# Patient Record
Sex: Female | Born: 1985 | Race: White | Hispanic: No | Smoking: Former smoker
Health system: Southern US, Community
[De-identification: ages and names within clinical notes are randomized; demographics above are authoritative.]

## PROBLEM LIST (undated history)

## (undated) DIAGNOSIS — F419 Anxiety disorder, unspecified: Secondary | ICD-10-CM

## (undated) DIAGNOSIS — G43109 Migraine with aura, not intractable, without status migrainosus: Secondary | ICD-10-CM

## (undated) DIAGNOSIS — Z9189 Other specified personal risk factors, not elsewhere classified: Secondary | ICD-10-CM

## (undated) DIAGNOSIS — R51 Headache: Secondary | ICD-10-CM

## (undated) DIAGNOSIS — G2581 Restless legs syndrome: Secondary | ICD-10-CM

## (undated) DIAGNOSIS — R030 Elevated blood-pressure reading, without diagnosis of hypertension: Secondary | ICD-10-CM

## (undated) DIAGNOSIS — T7840XA Allergy, unspecified, initial encounter: Secondary | ICD-10-CM

## (undated) DIAGNOSIS — O321XX0 Maternal care for breech presentation, not applicable or unspecified: Secondary | ICD-10-CM

## (undated) DIAGNOSIS — R12 Heartburn: Secondary | ICD-10-CM

## (undated) DIAGNOSIS — O26899 Other specified pregnancy related conditions, unspecified trimester: Secondary | ICD-10-CM

## (undated) HISTORY — PX: MOUTH SURGERY: SHX715

## (undated) HISTORY — DX: Migraine with aura, not intractable, without status migrainosus: G43.109

## (undated) HISTORY — DX: Other specified personal risk factors, not elsewhere classified: Z91.89

## (undated) HISTORY — DX: Allergy, unspecified, initial encounter: T78.40XA

## (undated) HISTORY — DX: Anxiety disorder, unspecified: F41.9

---

## 2008-11-05 LAB — CONVERTED CEMR LAB: Pap Smear: NEGATIVE

## 2009-11-20 ENCOUNTER — Ambulatory Visit: Payer: Self-pay | Admitting: Internal Medicine

## 2009-11-20 LAB — CONVERTED CEMR LAB
Basophils Absolute: 0 10*3/uL (ref 0.0–0.1)
Bilirubin, Direct: 0.1 mg/dL (ref 0.0–0.3)
Eosinophils Absolute: 0.1 10*3/uL (ref 0.0–0.7)
Eosinophils Relative: 2.2 % (ref 0.0–5.0)
GFR calc non Af Amer: 146.93 mL/min (ref >60)
Glucose, Bld: 93 mg/dL (ref 70–99)
HDL: 54.2 mg/dL (ref >39.00)
MCHC: 33.1 g/dL (ref 30.0–36.0)
MCV: 97.4 fL (ref 78.0–100.0)
Monocytes Absolute: 0.5 10*3/uL (ref 0.1–1.0)
Neutrophils Relative %: 49.8 % (ref 43.0–77.0)
Nitrite: NEGATIVE
Platelets: 201 10*3/uL (ref 150.0–400.0)
Potassium: 4 meq/L (ref 3.5–5.1)
Sodium: 141 meq/L (ref 135–145)
Specific Gravity, Urine: >=1.03 (ref 1.000–1.030)
Total Bilirubin: 0.4 mg/dL (ref 0.3–1.2)
Total Protein, Urine: NEGATIVE mg/dL
VLDL: 22.6 mg/dL (ref 0.0–40.0)
WBC: 5.1 10*3/uL (ref 4.5–10.5)
pH: 5.5 (ref 5.0–8.0)

## 2009-11-23 ENCOUNTER — Ambulatory Visit: Payer: Self-pay | Admitting: Internal Medicine

## 2009-11-23 ENCOUNTER — Encounter (INDEPENDENT_AMBULATORY_CARE_PROVIDER_SITE_OTHER): Payer: Self-pay | Admitting: *Deleted

## 2009-11-23 DIAGNOSIS — J45909 Unspecified asthma, uncomplicated: Secondary | ICD-10-CM | POA: Insufficient documentation

## 2009-11-23 DIAGNOSIS — Z87448 Personal history of other diseases of urinary system: Secondary | ICD-10-CM | POA: Insufficient documentation

## 2009-11-23 DIAGNOSIS — Z9189 Other specified personal risk factors, not elsewhere classified: Secondary | ICD-10-CM | POA: Insufficient documentation

## 2010-11-19 NOTE — Letter (Signed)
Summary: Out of Work  [facility]  [address] [place], [place] [address]   Phone: [phone]  Fax: [phone]    November 23, 2009   Employee:  Renee Shaw    To Whom It May Concern:   For Medical reasons, please excuse the above named employee from work for the following dates:  Start: 11/23/09    End: 11/25/09, Return to work on Monday 11/26/09    If you need additional information, please feel free to contact our office.         Sincerely,    Dr. Rene Paci

## 2010-11-19 NOTE — Assessment & Plan Note (Signed)
Summary: NEW [employer] PT--PHYSICAL--PKG--STC   Vital Signs:  Patient profile:   25 year old female Height:      68 inches (172.72 cm) Weight:      178.8 pounds (81.27 kg) BMI:     27.28 O2 Sat:      97 % on Room air Temp:     98.8 degrees F (37.11 degrees C) oral Pulse rate:   84 / minute BP sitting:   112 / 76  (left arm) Cuff size:   regular  Vitals Entered By: Orlan Leavens (November 23, 2009 8:13 AM)  O2 Flow:  Room air CC: New patient Is Patient Diabetic? No Pain Assessment Patient in pain? no        Primary Care Provider:  Newt Lukes MD  CC:  New patient.  History of Present Illness: new pt to me and our practice -  patient is here today for annual physical.  Patient feels generally well, no health concerns or problems until yesterday....   c/o diffuse mylaise and cough onset yesterday at noon problem precipitated by sick contacts at work a/w ST and chills but no fever, headache, sneezing or rash +thin sputum with cough, no nasal drainage symptoms better with allegra, worse at night  Preventive Screening-Counseling & Management  Alcohol-Tobacco     Alcohol drinks/day: <1     Alcohol Counseling: not indicated; use of alcohol is not excessive or problematic     Smoking Status: current     Tobacco Counseling: to quit use of tobacco products  Caffeine-Diet-Exercise     Does Patient Exercise: yes     Exercise Counseling: to improve exercise regimen     Depression Counseling: not indicated; screening negative for depression  Hep-HIV-STD-Contraception     Contraception Counseling: not indicated; no questions/concerns expressed  Safety-Violence-Falls     Seat Belt Use: yes     Helmet Use: yes     Firearms in the Home: no firearms in the home     Smoke Detectors: yes     Violence in the Home: no risk noted     Sexual Abuse: no  Clinical Review Panels:  Prevention   Last Pap Smear:  Interpretation/Result:Negative for intraepithelial Lesion or  Malignancy.    (11/05/2008)  Lipid Management   Cholesterol:  198 (11/20/2009)   LDL (bad choesterol):  121 (11/20/2009)   HDL (good cholesterol):  54.20 (11/20/2009)  CBC   WBC:  5.1 (11/20/2009)   RBC:  3.98 (11/20/2009)   Hgb:  12.8 (11/20/2009)   Hct:  38.7 (11/20/2009)   Platelets:  201.0 (11/20/2009)   MCV  97.4 (11/20/2009)   MCHC  33.1 (11/20/2009)   RDW  11.5 (11/20/2009)   PMN:  49.8 (11/20/2009)   Lymphs:  38.4 (11/20/2009)   Monos:  9.2 (11/20/2009)   Eosinophils:  2.2 (11/20/2009)   Basophil:  0.4 (11/20/2009)  Complete Metabolic Panel   Glucose:  93 (11/20/2009)   Sodium:  141 (11/20/2009)   Potassium:  4.0 (11/20/2009)   Chloride:  110 (11/20/2009)   CO2:  26 (11/20/2009)   BUN:  9 (11/20/2009)   Creatinine:  0.7 (11/20/2009)   Albumin:  3.9 (11/20/2009)   Total Protein:  7.0 (11/20/2009)   Calcium:  9.1 (11/20/2009)   Total Bili:  0.4 (11/20/2009)   Alk Phos:  42 (11/20/2009)   SGPT (ALT):  23 (11/20/2009)   SGOT (AST):  22 (11/20/2009)   Current Medications (verified): 1)  Multivitamins  Tabs (Multiple Vitamin) .... Once  Daily 2)  Mononessa 0.25-35 Mg-Mcg Tabs (Norgestimate-Eth Estradiol) .... Take 1 By Mouth Once Daily 3)  Fexofenadine Hcl 180 Mg Tabs (Fexofenadine Hcl) .... Take 1 By Mouth Once Daily 4)  Epipen 0.3 Mg/0.66ml Devi (Epinephrine) .... Use As Needed 5)  Prednisone 20 Mg Tabs (Prednisone) .... Take 1 By Mouth Once Daily For 21 Days 6)  Nasonex 50 Mcg/act Susp (Mometasone Furoate) .... Use Prn 7)  Astepro 0.15 % Soln (Azelastine Hcl) .... Use As Needed  Allergies (verified): No Known Drug Allergies  Past History:  Past Medical History: Asthma allergic rhinitis  MD rooster: gyn - [facility] - [facility] [place]  Past Surgical History: surgery to remove redicularin 2002 cyst in mouth 2006  Family History: Family History of Alcoholism/Addiction (other relative) Family History Diabetes 1st degree relative (parent) Family  History Lung cancer (grandparent) Family History of Prostate CA 1st degree relative <50 Brewing technologist)  Social History: Current Smoker single, lives alone but has boyfriend occassional alcohol employed as Museum/gallery exhibitions officer Smoking Status:  current Does Patient Exercise:  yes Seat Belt Use:  yes  Review of Systems       see HPI above. I have reviewed all other systems and they were negative.   Physical Exam  General:  alert, well-developed, well-nourished, and cooperative to examination.    Head:  Normocephalic and atraumatic without obvious abnormalities. No apparent alopecia or balding. Eyes:  vision grossly intact; pupils equal, round and reactive to light.  conjunctiva and lids normal.    Ears:  normal pinnae bilaterally, without erythema, swelling, or tenderness to palpation. TMs clear, without effusion, or cerumen impaction. Hearing grossly normal bilaterally  Nose:  External nasal examination shows no deformity or inflammation. Nasal mucosa are pink and moist without lesions or exudates. Mouth:  teeth and gums in good repair; mucous membranes moist, without lesions or ulcers. oropharynx clear without exudate, min erythema.  Neck:  supple, full ROM, no masses, no thyromegaly; no thyroid nodules or tenderness. no JVD or carotid bruits.   Lungs:  normal respiratory effort, no intercostal retractions or use of accessory muscles; few scattered rhonchi bilaterally - no crackles and no wheezes.    Heart:  normal rate, regular rhythm, no murmur, and no rub. BLE without edema. normal DP pulses and normal cap refill in all 4 extremities    Abdomen:  soft, non-tender, normal bowel sounds, no distention; no masses and no appreciable hepatomegaly or splenomegaly.   Genitalia:  defer to gyn Msk:  No deformity or scoliosis noted of thoracic or lumbar spine.   Neurologic:  alert & oriented X3 and cranial nerves II-XII symetrically intact.  strength normal in all extremities, sensation intact to  light touch, and gait normal. speech fluent without dysarthria or aphasia; follows commands with good comprehension.  Skin:  no rashes, vesicles, ulcers, or erythema. No nodules or irregularity to palpation.  Psych:  Oriented X3, memory intact for recent and remote, normally interactive, good eye contact, not anxious appearing, not depressed appearing, and not agitated.      Impression & Recommendations:  Problem # 1:  PREVENTIVE HEALTH CARE (ICD-V70.0) Patient has been counseled on age-appropriate routine health concerns for screening and prevention. These are reviewed and up-to-date. Immunizations are up-to-date or declined. Labs reviewed.   Problem # 2:  ACUTE BRONCHITIS (ICD-466.0)  His updated medication list for this problem includes:    Azithromycin 250 Mg Tabs (Azithromycin) .Marland Kitchen... 2 tabs by mouth today, then 1 by mouth daily starting tomorrow  Take antibiotics  and other medications as directed. Encouraged to push clear liquids, get enough rest, and take acetaminophen as needed. To be seen in 5-7 days if no improvement, sooner if worse.  Complete Medication List: 1)  Multivitamins Tabs (Multiple vitamin) .... Once daily 2)  Mononessa 0.25-35 Mg-mcg Tabs (Norgestimate-eth estradiol) .... Take 1 by mouth once daily 3)  Fexofenadine Hcl 180 Mg Tabs (Fexofenadine hcl) .... Take 1 by mouth once daily 4)  Epipen 0.3 Mg/0.42ml Devi (Epinephrine) .... Use as needed 5)  Prednisone 20 Mg Tabs (Prednisone) .... Take 1 by mouth once daily for 21 days 6)  Nasonex 50 Mcg/act Susp (Mometasone furoate) .... Use prn 7)  Astepro 0.15 % Soln (Azelastine hcl) .... Use as needed 8)  Azithromycin 250 Mg Tabs (Azithromycin) .... 2 tabs by mouth today, then 1 by mouth daily starting tomorrow  Patient Instructions: 1)  it was good to see you today.  2)  labs reviewed and look good! 3)  antibiotics for you bronchitis - Zpack - your prescription has been electronically submitted to your pharmacy. Please  take as directed. Contact our office if you believe you're having problems with the medication(s).  Caution with birth control while on antibiotics as discussed 4)  If you have very high fevers (>102) call and will we consider treating with Tamiflu for possible flu, but no evidence for flu at this point in time 5)  Acute bronchitis:  take over the counter cough medications. call if no improvement in  5-7 days, sooner if increasing cough, fever, or new symptoms (shortness of breath, chest pain). 6)  STOP SMOKING!! 7)  Please schedule a follow-up appointment annually for labs, sooner if problems.  Prescriptions: AZITHROMYCIN 250 MG TABS (AZITHROMYCIN) 2 tabs by mouth today, then 1 by mouth daily starting tomorrow  #6 x 0   Entered and Authorized by:   Newt Lukes MD   Signed by:   Newt Lukes MD on 11/23/2009   Method used:   Electronically to        [facility] [address] [id number]* (retail)       [address] [place], [place]  [address]       Ph: [phone]       Fax: [phone]   RxID:   [id number]    Pap Smear  Procedure date:  11/05/2008  Findings:      Interpretation/Result:Negative for intraepithelial Lesion or Malignancy.

## 2012-02-11 ENCOUNTER — Ambulatory Visit: Payer: Self-pay | Admitting: Internal Medicine

## 2012-03-18 ENCOUNTER — Ambulatory Visit: Payer: 59 | Admitting: Internal Medicine

## 2012-03-18 VITALS — BP 134/83 | HR 81 | Temp 98.4°F | Resp 16 | Ht 69.0 in | Wt 200.0 lb

## 2012-03-18 DIAGNOSIS — G43109 Migraine with aura, not intractable, without status migrainosus: Secondary | ICD-10-CM

## 2012-03-18 MED ORDER — CYCLOBENZAPRINE HCL 10 MG PO TABS: 10.0000 mg | ORAL_TABLET | Freq: Three times a day (TID) | ORAL | Status: DC | PRN

## 2012-03-18 NOTE — Patient Instructions (Signed)

## 2012-03-18 NOTE — Progress Notes (Signed)
  Subjective:    Patient ID: Renee Shaw, female    DOB: 1986-01-26, 26 y.o.   MRN: [id number]  HPI 2007 dx with migraine with visual aura, had neg ct scan of head. Has had 3 migraines with aura this week 1st time since 2007. Smokes and is on bcp.    Review of Systems Asthma and reflux    Objective:   Physical Exam  Constitutional: She is oriented to person, place, and time. She appears well-developed and well-nourished.  HENT:  Nose: Nose normal.  Eyes: EOM are normal. Pupils are equal, round, and reactive to light.  Neck: Normal range of motion. Neck supple.  Cardiovascular: Normal rate, regular rhythm and normal heart sounds.   Pulmonary/Chest: Effort normal and breath sounds normal.  Neurological: She is alert and oriented to person, place, and time. She displays normal reflexes. No cranial nerve deficit. Coordination normal.       Balance excellent and no drift  Skin: Skin is warm and dry.  Psychiatric: She has a normal mood and affect. Her behavior is normal. Judgment and thought content normal.          Assessment & Plan:  Migraine with aura

## 2012-03-24 ENCOUNTER — Telehealth: Payer: Self-pay | Admitting: Internal Medicine

## 2012-03-24 DIAGNOSIS — Z309 Encounter for contraceptive management, unspecified: Secondary | ICD-10-CM

## 2012-03-24 NOTE — Telephone Encounter (Signed)
This note entered into correct chart.

## 2012-03-24 NOTE — Telephone Encounter (Signed)
Brittney Moser,   dob [birth year]04/1987. Thanks  Seen on Monday

## 2012-03-24 NOTE — Telephone Encounter (Signed)
I called patient, but she says that she has never been seen by [place]. She is a patient of Dr. Felicity Coyer. This was all entered in the wrong chart. Which patient is this suppose to go to?

## 2012-03-24 NOTE — Telephone Encounter (Signed)
We ordered a UPT from [facility] and got a UA instead!   All labs normal,  No anemia or iron deficiency.  She can come in for Depo provera injection but still needs to have a documented negative urine pregnancy tet so will do it when she comes for her shot.

## 2012-05-12 ENCOUNTER — Encounter: Payer: Self-pay | Admitting: Internal Medicine

## 2012-05-12 ENCOUNTER — Ambulatory Visit (INDEPENDENT_AMBULATORY_CARE_PROVIDER_SITE_OTHER): Payer: 59 | Admitting: Internal Medicine

## 2012-05-12 VITALS — BP 140/72 | HR 91 | Temp 98.7°F | Ht 69.0 in | Wt 201.8 lb

## 2012-05-12 DIAGNOSIS — G43109 Migraine with aura, not intractable, without status migrainosus: Secondary | ICD-10-CM

## 2012-05-12 DIAGNOSIS — F411 Generalized anxiety disorder: Secondary | ICD-10-CM

## 2012-05-12 DIAGNOSIS — F419 Anxiety disorder, unspecified: Secondary | ICD-10-CM | POA: Insufficient documentation

## 2012-05-12 MED ORDER — BUTALBITAL-APAP-CAFFEINE 50-325-40 MG PO TABS: 1.0000 | ORAL_TABLET | Freq: Two times a day (BID) | ORAL | Status: AC | PRN

## 2012-05-12 MED ORDER — ALPRAZOLAM 0.25 MG PO TABS: 0.2500 mg | ORAL_TABLET | Freq: Two times a day (BID) | ORAL | Status: DC | PRN

## 2012-05-12 NOTE — Progress Notes (Signed)
Subjective:    Patient ID: Renee Shaw, female    DOB: 1986-01-29, 26 y.o.   MRN: [id number]  Migraine  This is a recurrent problem. Episode onset: 2007 dx - MRI/labs negative at that time; no recurrence until 02/2012. The problem occurs intermittently. The problem has been waxing and waning. The pain is located in the retro-orbital region. The pain does not radiate. The pain quality is similar to prior headaches. The quality of the pain is described as dull, band-like and pulsating. The pain is moderate. Associated symptoms include a visual change (preceeding HA symptoms). Pertinent negatives include no abnormal behavior, blurred vision, coughing, dizziness, fever, hearing loss, insomnia, nausea, neck pain, numbness, phonophobia, photophobia, scalp tenderness, seizures, sinus pressure, sore throat, swollen glands or weakness. The symptoms are aggravated by [facility] and work (?change BCP 02/2012 - now on progestin only - no estrogen). She has tried NSAIDs for the symptoms. The treatment provided no relief. Her past medical history is significant for hypertension, migraine headaches and migraines in the family. There is no history of cancer, recent head traumas or sinus disease.   Past Medical History  Diagnosis Date  . ASTHMA   . CHICKENPOX, HX OF   . Migraine headache with aura     Review of Systems  Constitutional: Negative for fever.  HENT: Negative for hearing loss, sore throat, neck pain and sinus pressure.   Eyes: Negative for blurred vision and photophobia.  Respiratory: Negative for cough.   Gastrointestinal: Negative for nausea.  Neurological: Negative for dizziness, seizures, weakness and numbness.  Psychiatric/Behavioral: The patient does not have insomnia.        Objective:   Physical Exam BP 140/72  Pulse 91  Temp 98.7 F (37.1 C) (Oral)  Ht 5\' 9"  (1.753 m)  Wt 201 lb 12.8 oz (91.536 kg)  BMI 29.80 kg/m2  SpO2 98% Wt Readings from Last 3 Encounters:  05/12/12 201 lb  12.8 oz (91.536 kg)  03/18/12 200 lb (90.719 kg)  11/23/09 178 lb 12.8 oz (81.103 kg)   Constitutional: She appears well-developed and well-nourished. No distress.  HENT: Head: Normocephalic and atraumatic. Ears: B TMs ok, no erythema or effusion; Nose: Nose normal. Mouth/Throat: Oropharynx is clear and moist. No oropharyngeal exudate.  Eyes: Conjunctivae and EOM are normal. Pupils are equal, round, and reactive to light. No scleral icterus.  Neck: Normal range of motion. Neck supple. No JVD present. No thyromegaly present.  Cardiovascular: Normal rate, regular rhythm and normal heart sounds.  No murmur heard. No BLE edema. Pulmonary/Chest: Effort normal and breath sounds normal. No respiratory distress. She has no wheezes.  Neurological: She is alert and oriented to person, place, and time. No cranial nerve deficit. Coordination normal.  Skin: Skin is warm and dry. No rash noted. No erythema.  Psychiatric: She has a normal mood and affect. Her behavior is normal. Judgment and thought content normal.   Lab Results  Component Value Date   WBC 5.1 11/20/2009   HGB 12.8* 11/20/2009   HCT 38.7* 11/20/2009   PLT 201.0 11/20/2009   GLUCOSE 93 11/20/2009   CHOL 198 11/20/2009   TRIG 113.0 11/20/2009   HDL 54.20 11/20/2009   LDLCALC 121* 11/20/2009   ALT 23 11/20/2009   AST 22 11/20/2009   NA 141 11/20/2009   K 4.0 11/20/2009   CL 110 11/20/2009   CREATININE 0.7 11/20/2009   BUN 9 11/20/2009   CO2 26 11/20/2009   TSH 2.43 11/20/2009       Assessment &  Plan:  See problem list. Medications and labs reviewed today.

## 2012-05-12 NOTE — Assessment & Plan Note (Signed)
Panic attacks with recurrent migraines Used low dose xanax prn in past for same -  resume now and consider SSRI tx daily if ineffective relief with xanax Support offered

## 2012-05-12 NOTE — Assessment & Plan Note (Addendum)
Hx same - dx 2007 - reports prior MRI and labs unremarkable at that time Now recurrent events since 02/2012 Not responding to NSAID therapy Neuro exam benign today, associated with significant anxiety component  Check MRI brain now, start esgic prn and encouraged follow up with gyn re: change in BCP?

## 2012-05-12 NOTE — Patient Instructions (Signed)
It was good to see you today. We have reviewed your prior records including labs and tests today we'll make referral to for MRI brain. Our office will contact you regarding appointment(s) once made. Use Esgic as needed for headache symptoms and xanax as needed for panic/anxiety symptoms  Your prescription(s) have been submitted to your pharmacy. Please take as directed and contact our office if you believe you are having problem(s) with the medication(s).

## 2012-05-19 ENCOUNTER — Ambulatory Visit
Admission: RE | Admit: 2012-05-19 | Discharge: 2012-05-19 | Disposition: A | Discharge status: Home / Self Care | Payer: 59 | Source: Ambulatory Visit | Attending: Internal Medicine | Admitting: Internal Medicine

## 2012-05-19 DIAGNOSIS — G43109 Migraine with aura, not intractable, without status migrainosus: Secondary | ICD-10-CM

## 2012-05-20 ENCOUNTER — Other Ambulatory Visit: Payer: Self-pay | Admitting: Internal Medicine

## 2012-05-20 DIAGNOSIS — G43109 Migraine with aura, not intractable, without status migrainosus: Secondary | ICD-10-CM

## 2012-05-20 DIAGNOSIS — R9089 Other abnormal findings on diagnostic imaging of central nervous system: Secondary | ICD-10-CM

## 2012-06-03 ENCOUNTER — Telehealth: Payer: Self-pay

## 2012-06-03 NOTE — Telephone Encounter (Signed)
Ok to schedule follow up with me as needed (if continued migraine symptoms)

## 2012-06-03 NOTE — Telephone Encounter (Signed)
Pt advised and transferred to schedule appt.  

## 2012-06-03 NOTE — Telephone Encounter (Signed)
Pt called stating that she is unable to afford an appt with neuro that she was referred to. Pt is requesting advisement from MD on available options in Primary Care, please advise.

## 2012-06-04 ENCOUNTER — Encounter: Payer: Self-pay | Admitting: Internal Medicine

## 2012-06-04 ENCOUNTER — Ambulatory Visit (INDEPENDENT_AMBULATORY_CARE_PROVIDER_SITE_OTHER): Payer: 59 | Admitting: Internal Medicine

## 2012-06-04 VITALS — BP 130/82 | HR 78 | Temp 97.5°F | Resp 16 | Wt 203.8 lb

## 2012-06-04 DIAGNOSIS — G43109 Migraine with aura, not intractable, without status migrainosus: Secondary | ICD-10-CM

## 2012-06-04 DIAGNOSIS — F419 Anxiety disorder, unspecified: Secondary | ICD-10-CM

## 2012-06-04 DIAGNOSIS — F411 Generalized anxiety disorder: Secondary | ICD-10-CM

## 2012-06-04 MED ORDER — ALPRAZOLAM 0.25 MG PO TABS: 0.2500 mg | ORAL_TABLET | Freq: Two times a day (BID) | ORAL | Status: DC | PRN

## 2012-06-04 MED ORDER — IBUPROFEN 800 MG PO TABS: 800.0000 mg | ORAL_TABLET | Freq: Three times a day (TID) | ORAL | Status: AC | PRN

## 2012-06-04 MED ORDER — TOPIRAMATE 25 MG PO TABS: 25.0000 mg | ORAL_TABLET | Freq: Two times a day (BID) | ORAL | Status: DC

## 2012-06-04 MED ORDER — BUTALBITAL-APAP-CAFFEINE 50-325-40 MG PO TABS: 1.0000 | ORAL_TABLET | Freq: Two times a day (BID) | ORAL | Status: AC | PRN

## 2012-06-04 NOTE — Progress Notes (Signed)
Subjective:    Patient ID: Renee Shaw, female    DOB: 03-26-1986, 26 y.o.   MRN: [id number]  Migraine  This is a chronic problem. Episode onset: 2007 dx - MRI/labs negative at that time; no recurrence until 02/2012 - MRI summer 2013 unremarkable. The problem occurs intermittently. The problem has been waxing and waning. The pain is located in the retro-orbital region. The pain does not radiate. The pain quality is similar to prior headaches. The quality of the pain is described as dull, band-like and pulsating. The pain is moderate. Associated symptoms include a visual change (preceeding HA symptoms). Pertinent negatives include no abnormal behavior, blurred vision, coughing, dizziness, fever, hearing loss, insomnia, nausea, neck pain, numbness, phonophobia, photophobia, scalp tenderness, seizures, sinus pressure, sore throat, swollen glands or weakness. The symptoms are aggravated by [facility] and work (?change BCP 02/2012 - now on progestin only - no estrogen). She has tried NSAIDs for the symptoms. The treatment provided no relief. Her past medical history is significant for hypertension, migraine headaches and migraines in the family. There is no history of cancer, recent head traumas or sinus disease.   Past Medical History  Diagnosis Date  . ASTHMA   . CHICKENPOX, HX OF   . Migraine headache with aura     Review of Systems  Constitutional: Negative for fever.  HENT: Negative for hearing loss, sore throat, neck pain and sinus pressure.   Eyes: Negative for blurred vision and photophobia.  Respiratory: Negative for cough.   Gastrointestinal: Negative for nausea.  Neurological: Negative for dizziness, seizures, weakness and numbness.  Psychiatric/Behavioral: The patient does not have insomnia.        Objective:   Physical Exam  BP 130/82  Pulse 78  Temp 97.5 F (36.4 C) (Oral)  Resp 16  Wt 203 lb 12 oz (92.42 kg)  SpO2 99% Wt Readings from Last 3 Encounters:  06/04/12 203 lb 12  oz (92.42 kg)  05/12/12 201 lb 12.8 oz (91.536 kg)  03/18/12 200 lb (90.719 kg)   Constitutional: She appears well-developed and well-nourished. No distress.  HENT: Head: Normocephalic and atraumatic. Ears: B TMs ok, no erythema or effusion; Nose: Nose normal. Mouth/Throat: Oropharynx is clear and moist. No oropharyngeal exudate.  Eyes: Conjunctivae and EOM are normal. Pupils are equal, round, and reactive to light. No scleral icterus.  Neck: Normal range of motion. Neck supple. No JVD present. No thyromegaly present.  Cardiovascular: Normal rate, regular rhythm and normal heart sounds.  No murmur heard. No BLE edema. Pulmonary/Chest: Effort normal and breath sounds normal. No respiratory distress. She has no wheezes.  Neurological: She is alert and oriented to person, place, and time. No cranial nerve deficit. Coordination normal.  Skin: Skin is warm and dry. No rash noted. No erythema.  Psychiatric: She has a normal mood and affect. Her behavior is normal. Judgment and thought content normal.   Lab Results  Component Value Date   WBC 5.1 11/20/2009   HGB 12.8* 11/20/2009   HCT 38.7* 11/20/2009   PLT 201.0 11/20/2009   GLUCOSE 93 11/20/2009   CHOL 198 11/20/2009   TRIG 113.0 11/20/2009   HDL 54.20 11/20/2009   LDLCALC 121* 11/20/2009   ALT 23 11/20/2009   AST 22 11/20/2009   NA 141 11/20/2009   K 4.0 11/20/2009   CL 110 11/20/2009   CREATININE 0.7 11/20/2009   BUN 9 11/20/2009   CO2 26 11/20/2009   TSH 2.43 11/20/2009   Mr Brain Wo Contrast  05/20/2012  *  RADIOLOGY REPORT*  Clinical Data: Migraine headache with aura  MRI HEAD WITHOUT CONTRAST  Technique:  Multiplanar, multiecho pulse sequences of the brain and surrounding structures were obtained according to standard protocol without intravenous contrast.  Comparison: None.  Findings: Ventricle size is normal.  Hyperintensity right frontal periventricular white matter.  No other white matter lesions are identified.  Brainstem is intact.  Negative for acute infarct  or mass.  There is a developmental venous anomaly in the left cerebellum without evidence of prior hemorrhage.  Chronic sinusitis with mucosal thickening in the maxillary sinus bilaterally.  IMPRESSION: Right frontal periventricular white matter lesion.  This is nonspecific and could be seen with chronic ischemia or multiple sclerosis.  No other white matter lesions.  Developmental venous anomaly left cerebellum.  Original Report Authenticated By: Camelia Phenes, M.D.     Assessment & Plan:  See problem list. Medications and labs reviewed today.

## 2012-06-04 NOTE — Patient Instructions (Signed)
It was good to see you today. We have reviewed your MRI brain.  Start topamax for migraine prevention - continue to use Esgic as needed for headache symptoms and xanax as needed for panic/anxiety symptoms  Your prescription(s) have been submitted to your pharmacy. Please take as directed and contact our office if you believe you are having problem(s) with the medication(s).  Please schedule followup in 2-3 months, call sooner if problems.

## 2012-06-04 NOTE — Assessment & Plan Note (Signed)
Panic attacks with recurrent migraines Using low dose xanax prn for same -  consider SSRI tx daily if ineffective relief with xanax and topamax (prior sertraline poorly tolerated) Support offered

## 2012-06-04 NOTE — Assessment & Plan Note (Signed)
Hx same - dx 2007 - reports prior MRI and labs unremarkable at that time Now recurrent events since 02/2012 Not responding to NSAID therapy Neuro exam benign today, associated with significant anxiety component  MRI brain 05/2012 reviewed Start topamax -titrate as needed conitnue esgic prn

## 2012-11-10 ENCOUNTER — Other Ambulatory Visit (INDEPENDENT_AMBULATORY_CARE_PROVIDER_SITE_OTHER): Payer: 59

## 2012-11-10 ENCOUNTER — Ambulatory Visit (INDEPENDENT_AMBULATORY_CARE_PROVIDER_SITE_OTHER): Payer: 59 | Admitting: Internal Medicine

## 2012-11-10 ENCOUNTER — Encounter: Payer: Self-pay | Admitting: Internal Medicine

## 2012-11-10 VITALS — BP 120/86 | HR 64 | Temp 98.1°F | Ht 69.0 in | Wt 201.0 lb

## 2012-11-10 DIAGNOSIS — F411 Generalized anxiety disorder: Secondary | ICD-10-CM

## 2012-11-10 DIAGNOSIS — R0600 Dyspnea, unspecified: Secondary | ICD-10-CM

## 2012-11-10 DIAGNOSIS — R5381 Other malaise: Secondary | ICD-10-CM

## 2012-11-10 DIAGNOSIS — R5383 Other fatigue: Secondary | ICD-10-CM

## 2012-11-10 DIAGNOSIS — R0789 Other chest pain: Secondary | ICD-10-CM

## 2012-11-10 DIAGNOSIS — F419 Anxiety disorder, unspecified: Secondary | ICD-10-CM

## 2012-11-10 DIAGNOSIS — R0989 Other specified symptoms and signs involving the circulatory and respiratory systems: Secondary | ICD-10-CM

## 2012-11-10 LAB — BASIC METABOLIC PANEL
BUN: 9 mg/dL (ref 6–23)
CO2: 27 mEq/L (ref 19–32)
Calcium: 9.7 mg/dL (ref 8.4–10.5)
Glucose, Bld: 98 mg/dL (ref 70–99)
Sodium: 139 mEq/L (ref 135–145)

## 2012-11-10 LAB — HEPATIC FUNCTION PANEL
AST: 25 U/L (ref 0–37)
Alkaline Phosphatase: 65 U/L (ref 39–117)
Bilirubin, Direct: 0 mg/dL (ref 0.0–0.3)
Total Bilirubin: 0.6 mg/dL (ref 0.3–1.2)

## 2012-11-10 LAB — CBC WITH DIFFERENTIAL/PLATELET
Basophils Absolute: 0 10*3/uL (ref 0.0–0.1)
Eosinophils Absolute: 0.2 10*3/uL (ref 0.0–0.7)
Hemoglobin: 13.3 g/dL (ref 12.0–15.0)
Lymphocytes Relative: 37.9 % (ref 12.0–46.0)
Lymphs Abs: 3 10*3/uL (ref 0.7–4.0)
MCHC: 33.9 g/dL (ref 30.0–36.0)
Neutro Abs: 4.1 10*3/uL (ref 1.4–7.7)
Platelets: 238 10*3/uL (ref 150.0–400.0)
RDW: 12.9 % (ref 11.5–14.6)

## 2012-11-10 MED ORDER — ALPRAZOLAM 0.25 MG PO TABS: 0.2500 mg | ORAL_TABLET | Freq: Two times a day (BID) | ORAL | Status: AC | PRN

## 2012-11-10 NOTE — Patient Instructions (Addendum)
It was good to see you today. .Test(s) ordered today. Your results will be released to MyChart (or called to you) after review, usually within 72hours after test completion. If any changes need to be made, you will be notified at that same time. Resume xanax as needed for panic/anxiety symptoms  Your prescription(s) have been submitted to your pharmacy. Please take as directed and contact our office if you believe you are having problem(s) with the medication(s).  If unimproved or worsening symptoms, call for consideration of SNRI medications as discussed Anxiety and Panic Attacks Your caregiver has informed you that you are having an anxiety or panic attack. There may be many forms of this. Most of the time these attacks come suddenly and without warning. They come at any time of day, including periods of sleep, and at any time of life. They may be strong and unexplained. Although panic attacks are very scary, they are physically harmless. Sometimes the cause of your anxiety is not known. Anxiety is a protective mechanism of the body in its fight or flight mechanism. Most of these perceived danger situations are actually nonphysical situations (such as anxiety over losing a job). CAUSES   The causes of an anxiety or panic attack are many. Panic attacks may occur in otherwise healthy people given a certain set of circumstances. There may be a genetic cause for panic attacks. Some medications may also have anxiety as a side effect. SYMPTOMS   Some of the most common feelings are:  Intense terror.   Dizziness, feeling faint.   Hot and cold flashes.   Fear of going crazy.   Feelings that nothing is real.   Sweating.   Shaking.   Chest pain or a fast heartbeat (palpitations).   Smothering, choking sensations.   Feelings of impending doom and that death is near.   Tingling of extremities, this may be from over-breathing.   Altered reality (derealization).   Being detached from yourself  (depersonalization).  Several symptoms can be present to make up anxiety or panic attacks. DIAGNOSIS   The evaluation by your caregiver will depend on the type of symptoms you are experiencing. The diagnosis of anxiety or panic attack is made when no physical illness can be determined to be a cause of the symptoms. TREATMENT   Treatment to prevent anxiety and panic attacks may include:  Avoidance of circumstances that cause anxiety.   Reassurance and relaxation.   Regular exercise.   Relaxation therapies, such as yoga.   Psychotherapy with a psychiatrist or therapist.   Avoidance of caffeine, alcohol and illegal drugs.   Prescribed medication.  SEEK IMMEDIATE MEDICAL CARE IF:    You experience panic attack symptoms that are different than your usual symptoms.   You have any worsening or concerning symptoms.  Document Released: 10/06/2005 Document Revised: 12/29/2011 Document Reviewed: 02/07/2010 [facility] Patient Information 2013 [place], [place].

## 2012-11-10 NOTE — Progress Notes (Signed)
  Subjective:    Patient ID: Renee Shaw, female    DOB: 04-05-1986, 27 y.o.   MRN: [id number]  HPI Comments: symptoms improved with exercise, "but return as soon as i am not distracted". Current symptoms similar to prior anxiety attacks and anxiety. Feels stressed about ongoing wedding plans for upcoming wedding in 2 months  Shortness of Breath This is a new problem. The current episode started yesterday. The problem occurs constantly. The problem has been waxing and waning. Associated symptoms include chest pain (ss tightness). Pertinent negatives include no abdominal pain, claudication, ear pain, fever, headaches, hemoptysis, leg pain, leg swelling, neck pain, orthopnea, PND, rash, rhinorrhea, sputum production, swollen glands, vomiting or wheezing. The symptoms are aggravated by emotional upset. The patient has no known risk factors for DVT/PE. She has tried nothing for the symptoms. Her past medical history is significant for asthma. There is no history of allergies, CAD, chronic lung disease, COPD, DVT, a heart failure, PE, pneumonia or a recent surgery.    Past Medical History  Diagnosis Date  . ASTHMA   . CHICKENPOX, HX OF   . Migraine headache with aura     Review of Systems  Constitutional: Negative for fever.  HENT: Negative for ear pain, rhinorrhea and neck pain.   Respiratory: Positive for shortness of breath. Negative for hemoptysis, sputum production and wheezing.   Cardiovascular: Positive for chest pain (ss tightness). Negative for orthopnea, claudication, leg swelling and PND.  Gastrointestinal: Negative for vomiting and abdominal pain.  Skin: Negative for rash.  Neurological: Negative for headaches.       Objective:   Physical Exam BP 120/86  Pulse 64  Temp 98.1 F (36.7 C) (Oral)  Ht 5\' 9"  (1.753 m)  Wt 201 lb (91.173 kg)  BMI 29.68 kg/m2  SpO2 99% Wt Readings from Last 3 Encounters:  11/10/12 201 lb (91.173 kg)  06/04/12 203 lb 12 oz (92.42 kg)    05/12/12 201 lb 12.8 oz (91.536 kg)   Constitutional: She appears well-developed and well-nourished. No distress.  Neck: Normal range of motion. Neck supple. No JVD present. No thyromegaly present.  Cardiovascular: Normal rate, regular rhythm and normal heart sounds.  No murmur heard. No BLE edema. Pulmonary/Chest: Effort normal and breath sounds normal. No respiratory distress. She has no wheezes.  Neurological: She is alert and oriented to person, place, and time. No cranial nerve deficit. Coordination normal.  Psychiatric: She has a normal mood and affect. Her behavior is normal. Judgment and thought content normal.   Lab Results  Component Value Date   WBC 5.1 11/20/2009   HGB 12.8* 11/20/2009   HCT 38.7* 11/20/2009   PLT 201.0 11/20/2009   GLUCOSE 93 11/20/2009   CHOL 198 11/20/2009   TRIG 113.0 11/20/2009   HDL 54.20 11/20/2009   LDLCALC 121* 11/20/2009   ALT 23 11/20/2009   AST 22 11/20/2009   NA 141 11/20/2009   K 4.0 11/20/2009   CL 110 11/20/2009   CREATININE 0.7 11/20/2009   BUN 9 11/20/2009   CO2 26 11/20/2009   TSH 2.43 11/20/2009        Assessment & Plan:  shortness of breath Atypical chest pain - ongoing x 36h - suspect emotional component fatigue nonspecific symptoms/exam - check screening labs Resume prn xanax - see below re: anxiety Reassurance provided today

## 2012-11-10 NOTE — Assessment & Plan Note (Signed)
Hx panic attacks with recurrent migraines - headache improved, but increase anxiety/insomnia symptoms with ongoing emotional stressors - planning 01/2013 wedding Previously using low dose xanax prn for same - refill same consider SSRI tx daily if ineffective relief with xanax (prior sertraline and paroxetine trials poorly tolerated) Support offered

## 2012-11-14 ENCOUNTER — Encounter: Payer: Self-pay | Admitting: Internal Medicine

## 2012-11-15 MED ORDER — VENLAFAXINE HCL ER 37.5 MG PO CP24: 37.5000 mg | ORAL_CAPSULE | Freq: Every day | ORAL | Status: DC

## 2013-01-18 ENCOUNTER — Ambulatory Visit: Payer: 59 | Admitting: Physician Assistant

## 2013-01-18 VITALS — BP 128/74 | HR 68 | Temp 98.0°F | Resp 18 | Ht 69.0 in | Wt 197.0 lb

## 2013-01-18 DIAGNOSIS — J029 Acute pharyngitis, unspecified: Secondary | ICD-10-CM

## 2013-01-18 MED ORDER — FIRST-DUKES MOUTHWASH MT SUSP: 10.0000 mL | OROMUCOSAL | Status: DC | PRN

## 2013-01-18 NOTE — Patient Instructions (Signed)
Your strep test is negative today.  Begin using ibuprofen 600mg  every 8 hours with food.  Additionally you may use the magic mouthwash every 2 hours as needed.  Plenty of fluids and rest.  Warm salt water gargles will also be helpful.  Stop smoking!  Good hand hygiene to prevent the spread of germs.  If you are worsening or not improving, please let us know.    Viral Pharyngitis Viral pharyngitis is a viral infection that produces redness, pain, and swelling (inflammation) of the throat. It can spread from person to person (contagious). CAUSES Viral pharyngitis is caused by inhaling a large amount of certain germs called viruses. Many different viruses cause viral pharyngitis. SYMPTOMS Symptoms of viral pharyngitis include:  Sore throat.  Tiredness.  Stuffy nose.  Low-grade fever.  Congestion.  Cough. TREATMENT Treatment includes rest, drinking plenty of fluids, and the use of over-the-counter medication (approved by your caregiver). HOME CARE INSTRUCTIONS   Drink enough fluids to keep your urine clear or pale yellow.  Eat soft, cold foods such as ice cream, frozen ice pops, or gelatin dessert.  Gargle with warm salt water (1 tsp salt per 1 qt of water).  If over age 59, throat lozenges may be used safely.  Only take over-the-counter or prescription medicines for pain, discomfort, or fever as directed by your caregiver. Do not take aspirin. To help prevent spreading viral pharyngitis to others, avoid:  Mouth-to-mouth contact with others.  Sharing utensils for eating and drinking.  Coughing around others. SEEK MEDICAL CARE IF:   You are better in a few days, then become worse.  You have a fever or pain not helped by pain medicines.  There are any other changes that concern you. Document Released: 07/16/2005 Document Revised: 12/29/2011 Document Reviewed: 12/12/2010 [facility] Patient Information 2013 [place], [place].

## 2013-01-18 NOTE — Progress Notes (Signed)
  Subjective:    Patient ID: Renee Shaw, female    DOB: Aug 06, 1986, 27 y.o.   MRN: [id number]  HPI   [name] is a 27 yr old female here with concern for illness.  Woke up with sore throat this morning.  Feels like tonsils are swollen.  Hurts to talk, hurts to swallow.  Feels like it's getting worse.  Denies nasal congestion, cough, fever, chills, GI symptoms.  Does endorse HA today.  Not aware of any sick contacts.  Does not spend time with children.  Has not used anything for symptoms.  Currently smoking 1/2ppd   Review of Systems  Constitutional: Negative for fever and chills.  HENT: Positive for sore throat. Negative for ear pain, congestion and rhinorrhea.   Respiratory: Negative for cough, shortness of breath and wheezing.   Cardiovascular: Negative.   Gastrointestinal: Negative.   Musculoskeletal: Negative.   Skin: Negative.   Neurological: Positive for headaches.       Objective:   Physical Exam  Vitals reviewed. Constitutional: She is oriented to person, place, and time. She appears well-developed and well-nourished. No distress.  HENT:  Head: Normocephalic and atraumatic.  Right Ear: Tympanic membrane and ear canal normal.  Left Ear: Tympanic membrane and ear canal normal.  Nose: Nose normal. Right sinus exhibits no maxillary sinus tenderness and no frontal sinus tenderness. Left sinus exhibits no maxillary sinus tenderness and no frontal sinus tenderness.  Mouth/Throat: Uvula is midline and mucous membranes are normal. Posterior oropharyngeal erythema present. No oropharyngeal exudate or posterior oropharyngeal edema.  Eyes: Conjunctivae are normal. No scleral icterus.  Neck: Neck supple.  Cardiovascular: Normal rate and normal heart sounds.   Pulmonary/Chest: Effort normal and breath sounds normal. She has no wheezes. She has no rales.  Lymphadenopathy:    She has no cervical adenopathy.  Neurological: She is alert and oriented to person, place, and time.  Skin:  Skin is warm and dry.  Psychiatric: She has a normal mood and affect. Her behavior is normal.     Filed Vitals:   01/18/13 1331  BP: 128/74  Pulse: 68  Temp: 98 F (36.7 C)  Resp: 18     Results for orders placed in visit on 01/18/13  POCT RAPID STREP A (OFFICE)      Result Value Range   Rapid Strep A Screen Negative  Negative       Assessment & Plan:  Sore throat - Plan: POCT rapid strep A, Diphenhyd-Hydrocort-Nystatin (FIRST-DUKES MOUTHWASH) SUSP   [name] is a pleasant 27 yr old female here with 1 day of sore throat.  Suspect viral etiology.  Rapid strep is negative.  Afebrile, lungs CTA, no associated symptoms.  Will treat with ibuprofen and magic mouthwash.  Warm salt water gargles.  Push fluids.  Rest.  Good hand hygiene to prevent the spread of germs.  If worsening or not improving, specifically fever, increased pain, new symptoms, pt will RTC.

## 2013-08-25 ENCOUNTER — Other Ambulatory Visit: Payer: Self-pay

## 2013-10-28 ENCOUNTER — Ambulatory Visit (INDEPENDENT_AMBULATORY_CARE_PROVIDER_SITE_OTHER): Payer: 59 | Admitting: Family Medicine

## 2013-10-28 ENCOUNTER — Encounter: Payer: Self-pay | Admitting: Family Medicine

## 2013-10-28 ENCOUNTER — Ambulatory Visit: Payer: 59 | Admitting: Internal Medicine

## 2013-10-28 VITALS — BP 130/74 | HR 78 | Temp 98.8°F | Wt 192.0 lb

## 2013-10-28 DIAGNOSIS — G44209 Tension-type headache, unspecified, not intractable: Secondary | ICD-10-CM

## 2013-10-28 MED ORDER — IBUPROFEN 800 MG PO TABS: 800.0000 mg | ORAL_TABLET | Freq: Four times a day (QID) | ORAL | Status: DC | PRN

## 2013-10-28 NOTE — Progress Notes (Signed)
Pre visit review using our clinic review tool, if applicable. No additional management support is needed unless otherwise documented below in the visit note. 

## 2013-10-28 NOTE — Progress Notes (Signed)
   Subjective:    Patient ID: Renee Shaw, female    DOB: November 11, 1985, 28 y.o.   MRN: [id number]  HPI Here for one week of a constant HA which starts at the back of the head and spreads over the top of the head. No nausea or vision changes. No light or sound sensitivity. She has a hx of migraine HAs with auras., and these recent HAs are very different in nature. 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 She uses 2 monitors at her computer and these sit to the side of her keyboard. She recently had an eye exam and she found out that wearing sun glasses helps to prevent migraines. She had a normal brain MRI on 05-12-12.    Review of Systems  Constitutional: Negative.   Eyes: Negative.   Neurological: Positive for headaches. Negative for dizziness, tremors, seizures, syncope, facial asymmetry, speech difficulty, weakness, light-headedness and numbness.       Objective:   Physical Exam  Constitutional: She is oriented to person, place, and time. She appears well-developed and well-nourished. No distress.  HENT:  Head: Normocephalic and atraumatic.  Right Ear: External ear normal.  Left Ear: External ear normal.  Nose: Nose normal.  Mouth/Throat: Oropharynx is clear and moist.  Eyes: Conjunctivae and EOM are normal. Pupils are equal, round, and reactive to light.  Neck: Neck supple.  Neurological: She is alert and oriented to person, place, and time. She has normal reflexes. No cranial nerve deficit. She exhibits normal muscle tone. Coordination normal.          Assessment & Plan:  These are tension headaches and they probably come from her using her computer for extended periods of time. Advised her to raise her chair to achieve a more ergonomic position at her desk. Move the monitors so she does not have to turn her head to the side. Try to get up, stretch and walk around for 5 minutes every hour. Use Ibuprofen 800 mg prn. Use moist heat and get  a massage when needed.

## 2013-11-03 ENCOUNTER — Ambulatory Visit: Payer: 59 | Admitting: Family Medicine

## 2013-11-04 ENCOUNTER — Encounter: Payer: Self-pay | Admitting: Internal Medicine

## 2013-11-04 ENCOUNTER — Ambulatory Visit (INDEPENDENT_AMBULATORY_CARE_PROVIDER_SITE_OTHER): Payer: 59 | Admitting: Internal Medicine

## 2013-11-04 VITALS — BP 120/82 | HR 68 | Temp 98.5°F | Wt 195.0 lb

## 2013-11-04 DIAGNOSIS — R51 Headache: Secondary | ICD-10-CM

## 2013-11-04 DIAGNOSIS — J3489 Other specified disorders of nose and nasal sinuses: Secondary | ICD-10-CM

## 2013-11-04 DIAGNOSIS — F419 Anxiety disorder, unspecified: Secondary | ICD-10-CM

## 2013-11-04 DIAGNOSIS — F411 Generalized anxiety disorder: Secondary | ICD-10-CM

## 2013-11-04 MED ORDER — AMITRIPTYLINE HCL 10 MG PO TABS: 5.0000 mg | ORAL_TABLET | Freq: Every day | ORAL | Status: DC

## 2013-11-04 NOTE — Assessment & Plan Note (Signed)
Hx panic attacks with recurrent migraines - Exacerbated prior to April 2014 wedding, then headache improved Currently reports anxiety/insomnia symptoms with ongoing emotional stressors - Previously using low dose xanax prn for same, but intolerant of prior sertraline and paroxetine trials Given low-dose amitriptyline - we reviewed potential risk/benefit and possible side effects - pt understands and agrees to same   Support offered
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
## 2013-11-04 NOTE — Patient Instructions (Addendum)
It was good to see you today.  We have reviewed your prior records including labs and tests today  Will refer for CT head to evaluate for potential sinus problem causing pain symptoms. Your results will be released to MyChart (or called to you) after review, usually within 72hours after test completion. If any changes need to be made, you will be notified at that same time.  Medications reviewed and updated. Continue ibuprofen as needed for headache and begin low dose amitriptyline at bedtime to help diminish the frequency and intensity of head pain  Your prescription(s) have been submitted to your pharmacy. Please take as directed and contact our office if you believe you are having problem(s) with the medication(s).  Followup in 3-4 weeks if headache symptoms unimproved, call sooner if worse  Tension Headache A tension headache is a feeling of pain, pressure, or aching often felt over the front and sides of the head. The pain can be dull or can feel tight (constricting). It is the most common type of headache. Tension headaches are not normally associated with nausea or vomiting and do not get worse with physical activity. Tension headaches can last 30 minutes to several days.  CAUSES  The exact cause is not known, but it may be caused by chemicals and hormones in the brain that lead to pain. Tension headaches often begin after stress, anxiety, or depression. Other triggers may include:  Alcohol.  Caffeine (too much or withdrawal).  Respiratory infections (colds, flu, sinus infections).  Dental problems or teeth clenching.  Fatigue.  Holding your head and neck in one position too long while using a computer. SYMPTOMS   Pressure around the head.   Dull, aching head pain.   Pain felt over the front and sides of the head.   Tenderness in the muscles of the head, neck, and shoulders. DIAGNOSIS  A tension headache is often diagnosed based on:   Symptoms.   Physical  examination.   A CT scan or MRI of your head. These tests may be ordered if symptoms are severe or unusual. TREATMENT  Medicines may be given to help relieve symptoms.  HOME CARE INSTRUCTIONS   Only take over-the-counter or prescription medicines for pain or discomfort as directed by your caregiver.   Lie down in a dark, quiet room when you have a headache.   Keep a journal to find out what may be triggering your headaches. For example, write down:  What you eat and drink.  How much sleep you get.  Any change to your diet or medicines.  Try massage or other relaxation techniques.   Ice packs or heat applied to the head and neck can be used. Use these 3 to 4 times per day for 15 to 20 minutes each time, or as needed.   Limit stress.   Sit up straight, and do not tense your muscles.   Quit smoking if you smoke.  Limit alcohol use.  Decrease the amount of caffeine you drink, or stop drinking caffeine.  Eat and exercise regularly.  Get 7 to 9 hours of sleep, or as recommended by your caregiver.  Avoid excessive use of pain medicine as recurrent headaches can occur.  SEEK MEDICAL CARE IF:   You have problems with the medicines you were prescribed.  Your medicines do not work.  You have a change from the usual headache.  You have nausea or vomiting. SEEK IMMEDIATE MEDICAL CARE IF:   Your headache becomes severe.  You have  a fever.  You have a stiff neck.  You have loss of vision.  You have muscular weakness or loss of muscle control.  You lose your balance or have trouble walking.  You feel faint or pass out.  You have severe symptoms that are different from your first symptoms. MAKE SURE YOU:   Understand these instructions.  Will watch your condition.  Will get help right away if you are not doing well or get worse. Document Released: 10/06/2005 Document Revised: 12/29/2011 Document Reviewed: 09/26/2011 [facility] Patient Information 2014  [place], [place].

## 2013-11-04 NOTE — Progress Notes (Signed)
Pre-visit discussion using our clinic review tool. No additional management support is needed unless otherwise documented below in the visit note.  

## 2013-11-10 ENCOUNTER — Ambulatory Visit (INDEPENDENT_AMBULATORY_CARE_PROVIDER_SITE_OTHER)
Admission: RE | Admit: 2013-11-10 | Discharge: 2013-11-10 | Disposition: A | Discharge status: Home / Self Care | Payer: 59 | Source: Ambulatory Visit | Attending: Internal Medicine | Admitting: Internal Medicine

## 2013-11-10 DIAGNOSIS — J3489 Other specified disorders of nose and nasal sinuses: Secondary | ICD-10-CM

## 2013-11-10 DIAGNOSIS — R51 Headache: Secondary | ICD-10-CM

## 2013-11-14 ENCOUNTER — Encounter: Payer: Self-pay | Admitting: Internal Medicine

## 2014-04-04 LAB — OB RESULTS CONSOLE ABO/RH: RH TYPE: NEGATIVE

## 2014-04-04 LAB — OB RESULTS CONSOLE ANTIBODY SCREEN: Antibody Screen: NEGATIVE

## 2014-04-04 LAB — OB RESULTS CONSOLE HEPATITIS B SURFACE ANTIGEN: Hepatitis B Surface Ag: NEGATIVE

## 2014-04-04 LAB — OB RESULTS CONSOLE RUBELLA ANTIBODY, IGM: RUBELLA: IMMUNE

## 2014-04-04 LAB — OB RESULTS CONSOLE RPR: RPR: NONREACTIVE

## 2014-04-04 LAB — OB RESULTS CONSOLE HIV ANTIBODY (ROUTINE TESTING): HIV: NONREACTIVE

## 2014-11-06 ENCOUNTER — Other Ambulatory Visit: Payer: Self-pay | Admitting: Obstetrics

## 2014-11-09 ENCOUNTER — Encounter (HOSPITAL_COMMUNITY): Payer: Self-pay

## 2014-11-09 ENCOUNTER — Encounter (HOSPITAL_COMMUNITY)
Admission: RE | Admit: 2014-11-09 | Discharge: 2014-11-09 | Disposition: A | Discharge status: Home / Self Care | Payer: 59 | Source: Ambulatory Visit | Attending: Obstetrics | Admitting: Obstetrics

## 2014-11-09 DIAGNOSIS — Z01818 Encounter for other preprocedural examination: Secondary | ICD-10-CM | POA: Insufficient documentation

## 2014-11-09 DIAGNOSIS — O321XX0 Maternal care for breech presentation, not applicable or unspecified: Secondary | ICD-10-CM | POA: Diagnosis not present

## 2014-11-09 HISTORY — DX: Other specified pregnancy related conditions, unspecified trimester: O26.899

## 2014-11-09 HISTORY — DX: Heartburn: R12

## 2014-11-09 HISTORY — DX: Restless legs syndrome: G25.81

## 2014-11-09 LAB — TYPE AND SCREEN
ABO/RH(D): O NEG
Antibody Screen: POSITIVE
DAT, IGG: NEGATIVE

## 2014-11-09 LAB — CBC
HEMATOCRIT: 32.1 % — AB (ref 36.0–46.0)
Hemoglobin: 10.7 g/dL — ABNORMAL LOW (ref 12.0–15.0)
MCH: 29.8 pg (ref 26.0–34.0)
MCHC: 33.3 g/dL (ref 30.0–36.0)
MCV: 89.4 fL (ref 78.0–100.0)
Platelets: 204 10*3/uL (ref 150–400)
RBC: 3.59 MIL/uL — ABNORMAL LOW (ref 3.87–5.11)
RDW: 12.9 % (ref 11.5–15.5)
WBC: 9.1 10*3/uL (ref 4.0–10.5)

## 2014-11-09 NOTE — Patient Instructions (Addendum)
   Your procedure is scheduled on:  Monday, Jan 25  [facility]: 12 Noon Pick up the phone at the desk and dial [phone] and inform us of your arrival.  Please call this number if you have any problems the morning of surgery: [phone]  Remember: Do not eat food after midnight: Sunday Do not drink clear liquids after: 9:30 AM Monday, day of surgery Take these medicines the morning of surgery with a SIP OF WATER: None  Do not wear jewelry, make-up, or FINGER nail polish No metal in your hair or on your body. Do not wear lotions, powders, perfumes.  You may wear deodorant.  Do not bring valuables to the hospital. Contacts, dentures or bridgework may not be worn into surgery.  Leave suitcase in the car. After Surgery it may be brought to your room. For patients being admitted to the hospital, checkout time is 11:00am the day of discharge.  Home with husband Joey cell [phone].

## 2014-11-10 ENCOUNTER — Inpatient Hospital Stay (HOSPITAL_COMMUNITY): Admission: RE | Admit: 2014-11-10 | Payer: Self-pay | Source: Ambulatory Visit

## 2014-11-10 LAB — RPR: RPR Ser Ql: NONREACTIVE

## 2014-11-13 ENCOUNTER — Encounter (HOSPITAL_COMMUNITY)
Admission: AD | Disposition: A | Discharge status: Home / Self Care | Payer: Self-pay | Source: Ambulatory Visit | Attending: Obstetrics

## 2014-11-13 ENCOUNTER — Inpatient Hospital Stay (HOSPITAL_COMMUNITY): Payer: 59 | Admitting: Anesthesiology

## 2014-11-13 ENCOUNTER — Inpatient Hospital Stay (HOSPITAL_COMMUNITY)
Admission: AD | Admit: 2014-11-13 | Discharge: 2014-11-15 | DRG: 765 | Disposition: A | Discharge status: Home / Self Care | Payer: 59 | Source: Ambulatory Visit | Attending: Obstetrics | Admitting: Obstetrics

## 2014-11-13 ENCOUNTER — Encounter (HOSPITAL_COMMUNITY): Payer: Self-pay | Admitting: *Deleted

## 2014-11-13 DIAGNOSIS — O368930 Maternal care for other specified fetal problems, third trimester, not applicable or unspecified: Secondary | ICD-10-CM | POA: Diagnosis present

## 2014-11-13 DIAGNOSIS — D62 Acute posthemorrhagic anemia: Secondary | ICD-10-CM | POA: Diagnosis present

## 2014-11-13 DIAGNOSIS — O3663X0 Maternal care for excessive fetal growth, third trimester, not applicable or unspecified: Secondary | ICD-10-CM | POA: Diagnosis present

## 2014-11-13 DIAGNOSIS — O9081 Anemia of the puerperium: Secondary | ICD-10-CM | POA: Diagnosis present

## 2014-11-13 DIAGNOSIS — Z3A39 39 weeks gestation of pregnancy: Secondary | ICD-10-CM | POA: Diagnosis present

## 2014-11-13 DIAGNOSIS — Z87891 Personal history of nicotine dependence: Secondary | ICD-10-CM

## 2014-11-13 DIAGNOSIS — O321XX0 Maternal care for breech presentation, not applicable or unspecified: Principal | ICD-10-CM

## 2014-11-13 HISTORY — DX: Maternal care for breech presentation, not applicable or unspecified: O32.1XX0

## 2014-11-13 SURGERY — Surgical Case
Anesthesia: Spinal | Site: Abdomen

## 2014-11-13 MED ORDER — NALOXONE HCL 0.4 MG/ML IJ SOLN: 0.4000 mg | INTRAMUSCULAR | Status: DC | PRN

## 2014-11-13 MED ORDER — BUPIVACAINE IN DEXTROSE 0.75-8.25 % IT SOLN
INTRATHECAL | Status: DC | PRN
  Administered 2014-11-13: 10.5 mg via INTRATHECAL

## 2014-11-13 MED ORDER — CEFAZOLIN SODIUM-DEXTROSE 2-3 GM-% IV SOLR
2.0000 g | INTRAVENOUS | Status: AC
  Administered 2014-11-13: 2 g via INTRAVENOUS

## 2014-11-13 MED ORDER — KETOROLAC TROMETHAMINE 30 MG/ML IJ SOLN
INTRAMUSCULAR | Status: AC
  Administered 2014-11-13: 30 mg
  Filled 2014-11-13: qty 1

## 2014-11-13 MED ORDER — MEPERIDINE HCL 25 MG/ML IJ SOLN: 6.2500 mg | INTRAMUSCULAR | Status: DC | PRN

## 2014-11-13 MED ORDER — HYDROMORPHONE HCL 1 MG/ML IJ SOLN: 0.2500 mg | INTRAMUSCULAR | Status: DC | PRN

## 2014-11-13 MED ORDER — SIMETHICONE 80 MG PO CHEW
80.0000 mg | CHEWABLE_TABLET | ORAL | Status: DC | PRN
  Administered 2014-11-14: 80 mg via ORAL

## 2014-11-13 MED ORDER — IBUPROFEN 600 MG PO TABS: 600.0000 mg | ORAL_TABLET | Freq: Four times a day (QID) | ORAL | Status: DC | PRN

## 2014-11-13 MED ORDER — NALBUPHINE HCL 10 MG/ML IJ SOLN: 5.0000 mg | Freq: Once | INTRAMUSCULAR | Status: AC | PRN

## 2014-11-13 MED ORDER — LACTATED RINGERS IV SOLN
Freq: Once | INTRAVENOUS | Status: AC
  Administered 2014-11-13: 13:00:00 via INTRAVENOUS

## 2014-11-13 MED ORDER — SODIUM CHLORIDE 0.9 % IJ SOLN: 3.0000 mL | INTRAMUSCULAR | Status: DC | PRN

## 2014-11-13 MED ORDER — SIMETHICONE 80 MG PO CHEW
80.0000 mg | CHEWABLE_TABLET | ORAL | Status: DC
  Administered 2014-11-13 – 2014-11-14 (×2): 80 mg via ORAL
  Filled 2014-11-13 (×2): qty 1

## 2014-11-13 MED ORDER — OXYTOCIN 40 UNITS IN LACTATED RINGERS INFUSION - SIMPLE MED: 62.5000 mL/h | INTRAVENOUS | Status: AC

## 2014-11-13 MED ORDER — LACTATED RINGERS IV SOLN
INTRAVENOUS | Status: DC
  Administered 2014-11-13: 14:00:00 via INTRAVENOUS

## 2014-11-13 MED ORDER — ONDANSETRON HCL 4 MG/2ML IJ SOLN: 4.0000 mg | INTRAMUSCULAR | Status: DC | PRN

## 2014-11-13 MED ORDER — CEFAZOLIN SODIUM-DEXTROSE 2-3 GM-% IV SOLR
INTRAVENOUS | Status: AC
  Filled 2014-11-13: qty 50

## 2014-11-13 MED ORDER — ONDANSETRON HCL 4 MG/2ML IJ SOLN: 4.0000 mg | Freq: Three times a day (TID) | INTRAMUSCULAR | Status: DC | PRN

## 2014-11-13 MED ORDER — OXYTOCIN 10 UNIT/ML IJ SOLN
40.0000 [IU] | INTRAVENOUS | Status: DC | PRN
  Administered 2014-11-13: 40 [IU] via INTRAVENOUS

## 2014-11-13 MED ORDER — HYDROMORPHONE HCL 1 MG/ML IJ SOLN
INTRAMUSCULAR | Status: AC
  Filled 2014-11-13: qty 1

## 2014-11-13 MED ORDER — NALBUPHINE HCL 10 MG/ML IJ SOLN
5.0000 mg | INTRAMUSCULAR | Status: DC | PRN
  Administered 2014-11-14: 5 mg via SUBCUTANEOUS
  Filled 2014-11-13: qty 1

## 2014-11-13 MED ORDER — DIPHENHYDRAMINE HCL 25 MG PO CAPS: 25.0000 mg | ORAL_CAPSULE | ORAL | Status: DC | PRN

## 2014-11-13 MED ORDER — PHENYLEPHRINE 8 MG IN D5W 100 ML (0.08MG/ML) PREMIX OPTIME
INJECTION | INTRAVENOUS | Status: DC | PRN
  Administered 2014-11-13: 60 ug/min via INTRAVENOUS

## 2014-11-13 MED ORDER — OXYCODONE-ACETAMINOPHEN 5-325 MG PO TABS
1.0000 | ORAL_TABLET | ORAL | Status: DC | PRN
  Administered 2014-11-14: 1 via ORAL
  Filled 2014-11-13 (×2): qty 1

## 2014-11-13 MED ORDER — DIPHENHYDRAMINE HCL 25 MG PO CAPS: 25.0000 mg | ORAL_CAPSULE | Freq: Four times a day (QID) | ORAL | Status: DC | PRN

## 2014-11-13 MED ORDER — ERYTHROMYCIN 5 MG/GM OP OINT
TOPICAL_OINTMENT | OPHTHALMIC | Status: AC
  Filled 2014-11-13: qty 1

## 2014-11-13 MED ORDER — LANOLIN HYDROUS EX OINT: 1.0000 "application " | TOPICAL_OINTMENT | CUTANEOUS | Status: DC | PRN

## 2014-11-13 MED ORDER — ZOLPIDEM TARTRATE 5 MG PO TABS: 5.0000 mg | ORAL_TABLET | Freq: Every evening | ORAL | Status: DC | PRN

## 2014-11-13 MED ORDER — TETANUS-DIPHTH-ACELL PERTUSSIS 5-2.5-18.5 LF-MCG/0.5 IM SUSP: 0.5000 mL | Freq: Once | INTRAMUSCULAR | Status: DC

## 2014-11-13 MED ORDER — OXYCODONE-ACETAMINOPHEN 5-325 MG PO TABS: 2.0000 | ORAL_TABLET | ORAL | Status: DC | PRN

## 2014-11-13 MED ORDER — PRENATAL MULTIVITAMIN CH
1.0000 | ORAL_TABLET | Freq: Every day | ORAL | Status: DC
  Administered 2014-11-14 – 2014-11-15 (×2): 1 via ORAL
  Filled 2014-11-13 (×2): qty 1

## 2014-11-13 MED ORDER — MENTHOL 3 MG MT LOZG: 1.0000 | LOZENGE | OROMUCOSAL | Status: DC | PRN

## 2014-11-13 MED ORDER — CEFAZOLIN SODIUM-DEXTROSE 2-3 GM-% IV SOLR: 2.0000 g | INTRAVENOUS | Status: DC

## 2014-11-13 MED ORDER — PHENYLEPHRINE 8 MG IN D5W 100 ML (0.08MG/ML) PREMIX OPTIME
INJECTION | INTRAVENOUS | Status: AC
  Filled 2014-11-13: qty 100

## 2014-11-13 MED ORDER — KETOROLAC TROMETHAMINE 30 MG/ML IJ SOLN: 30.0000 mg | Freq: Four times a day (QID) | INTRAMUSCULAR | Status: AC | PRN

## 2014-11-13 MED ORDER — DIPHENHYDRAMINE HCL 50 MG/ML IJ SOLN: 12.5000 mg | INTRAMUSCULAR | Status: DC | PRN

## 2014-11-13 MED ORDER — MORPHINE SULFATE 0.5 MG/ML IJ SOLN
INTRAMUSCULAR | Status: AC
  Filled 2014-11-13: qty 10

## 2014-11-13 MED ORDER — ONDANSETRON HCL 4 MG PO TABS: 4.0000 mg | ORAL_TABLET | ORAL | Status: DC | PRN

## 2014-11-13 MED ORDER — KETOROLAC TROMETHAMINE 30 MG/ML IJ SOLN
30.0000 mg | Freq: Four times a day (QID) | INTRAMUSCULAR | Status: AC | PRN
Start: 2014-11-13 — End: 2014-11-14

## 2014-11-13 MED ORDER — FENTANYL CITRATE 0.05 MG/ML IJ SOLN
INTRAMUSCULAR | Status: AC
  Filled 2014-11-13: qty 2

## 2014-11-13 MED ORDER — OXYTOCIN 10 UNIT/ML IJ SOLN
INTRAMUSCULAR | Status: AC
  Filled 2014-11-13: qty 4

## 2014-11-13 MED ORDER — DIBUCAINE 1 % RE OINT: 1.0000 "application " | TOPICAL_OINTMENT | RECTAL | Status: DC | PRN

## 2014-11-13 MED ORDER — LACTATED RINGERS IV SOLN: INTRAVENOUS | Status: DC

## 2014-11-13 MED ORDER — LACTATED RINGERS IV SOLN
INTRAVENOUS | Status: DC | PRN
  Administered 2014-11-13 (×2): via INTRAVENOUS

## 2014-11-13 MED ORDER — SCOPOLAMINE 1 MG/3DAYS TD PT72
MEDICATED_PATCH | TRANSDERMAL | Status: AC
  Administered 2014-11-13: 1.5 mg via TRANSDERMAL
  Filled 2014-11-13: qty 1

## 2014-11-13 MED ORDER — FENTANYL CITRATE 0.05 MG/ML IJ SOLN
INTRAMUSCULAR | Status: DC | PRN
  Administered 2014-11-13: 12.5 ug via INTRATHECAL

## 2014-11-13 MED ORDER — SENNOSIDES-DOCUSATE SODIUM 8.6-50 MG PO TABS
2.0000 | ORAL_TABLET | ORAL | Status: DC
  Administered 2014-11-13 – 2014-11-14 (×2): 2 via ORAL
  Filled 2014-11-13 (×2): qty 2

## 2014-11-13 MED ORDER — SIMETHICONE 80 MG PO CHEW
80.0000 mg | CHEWABLE_TABLET | Freq: Three times a day (TID) | ORAL | Status: DC
  Administered 2014-11-14 – 2014-11-15 (×3): 80 mg via ORAL
  Filled 2014-11-13 (×4): qty 1

## 2014-11-13 MED ORDER — IBUPROFEN 600 MG PO TABS
600.0000 mg | ORAL_TABLET | Freq: Four times a day (QID) | ORAL | Status: DC
  Administered 2014-11-13 – 2014-11-15 (×7): 600 mg via ORAL
  Filled 2014-11-13 (×7): qty 1

## 2014-11-13 MED ORDER — SCOPOLAMINE 1 MG/3DAYS TD PT72
1.0000 | MEDICATED_PATCH | Freq: Once | TRANSDERMAL | Status: DC
Start: 2014-11-13 — End: 2014-11-13
  Administered 2014-11-13: 1.5 mg via TRANSDERMAL

## 2014-11-13 MED ORDER — NALOXONE HCL 1 MG/ML IJ SOLN: 1.0000 ug/kg/h | INTRAVENOUS | Status: DC | PRN

## 2014-11-13 MED ORDER — WITCH HAZEL-GLYCERIN EX PADS: 1.0000 "application " | MEDICATED_PAD | CUTANEOUS | Status: DC | PRN

## 2014-11-13 MED ORDER — MORPHINE SULFATE (PF) 0.5 MG/ML IJ SOLN
INTRAMUSCULAR | Status: DC | PRN
  Administered 2014-11-13: .2 mg via INTRATHECAL

## 2014-11-13 MED ORDER — ONDANSETRON HCL 4 MG/2ML IJ SOLN
INTRAMUSCULAR | Status: AC
  Filled 2014-11-13: qty 2

## 2014-11-13 MED ORDER — NALBUPHINE HCL 10 MG/ML IJ SOLN: 5.0000 mg | INTRAMUSCULAR | Status: DC | PRN

## 2014-11-13 MED ORDER — PROMETHAZINE HCL 25 MG/ML IJ SOLN: 6.2500 mg | INTRAMUSCULAR | Status: DC | PRN

## 2014-11-13 MED ORDER — ONDANSETRON HCL 4 MG/2ML IJ SOLN
INTRAMUSCULAR | Status: DC | PRN
  Administered 2014-11-13: 4 mg via INTRAVENOUS

## 2014-11-13 SURGICAL SUPPLY — 31 items
BENZOIN TINCTURE PRP APPL 2/3 (GAUZE/BANDAGES/DRESSINGS) ×2 IMPLANT
CLAMP CORD UMBIL (MISCELLANEOUS) IMPLANT
CLOTH BEACON ORANGE TIMEOUT ST (SAFETY) ×2 IMPLANT
CONTAINER PREFILL 10% NBF 15ML (MISCELLANEOUS) IMPLANT
DRAPE SHEET LG 3/4 BI-LAMINATE (DRAPES) IMPLANT
DRSG OPSITE POSTOP 4X10 (GAUZE/BANDAGES/DRESSINGS) ×2 IMPLANT
DURAPREP 26ML APPLICATOR (WOUND CARE) ×2 IMPLANT
ELECT REM PT RETURN 9FT ADLT (ELECTROSURGICAL) ×2
ELECTRODE REM PT RTRN 9FT ADLT (ELECTROSURGICAL) ×1 IMPLANT
EXTRACTOR VACUUM M CUP 4 TUBE (SUCTIONS) IMPLANT
GLOVE BIO SURGEON STRL SZ 6.5 (GLOVE) ×2 IMPLANT
GLOVE BIOGEL PI IND STRL 7.0 (GLOVE) ×1 IMPLANT
GLOVE BIOGEL PI INDICATOR 7.0 (GLOVE) ×1
GOWN STRL REUS W/TWL LRG LVL3 (GOWN DISPOSABLE) ×4 IMPLANT
KIT ABG SYR 3ML LUER SLIP (SYRINGE) IMPLANT
NEEDLE HYPO 25X5/8 SAFETYGLIDE (NEEDLE) IMPLANT
NS IRRIG 1000ML POUR BTL (IV SOLUTION) ×2 IMPLANT
PACK C SECTION WH (CUSTOM PROCEDURE TRAY) ×2 IMPLANT
PAD OB MATERNITY 4.3X12.25 (PERSONAL CARE ITEMS) ×2 IMPLANT
STAPLER VISISTAT 35W (STAPLE) IMPLANT
STRIP CLOSURE SKIN 1/2X4 (GAUZE/BANDAGES/DRESSINGS) ×2 IMPLANT
SUT MON AB 4-0 PS1 27 (SUTURE) ×2 IMPLANT
SUT PLAIN 0 NONE (SUTURE) IMPLANT
SUT PLAIN 2 0 XLH (SUTURE) ×2 IMPLANT
SUT VIC AB 0 CT1 36 (SUTURE) ×4 IMPLANT
SUT VIC AB 0 CTX 36 (SUTURE) ×6
SUT VIC AB 0 CTX36XBRD ANBCTRL (SUTURE) ×3 IMPLANT
SUT VIC AB 2-0 CT1 27 (SUTURE) ×1
SUT VIC AB 2-0 CT1 TAPERPNT 27 (SUTURE) ×1 IMPLANT
TOWEL OR 17X24 6PK STRL BLUE (TOWEL DISPOSABLE) ×2 IMPLANT
TRAY FOLEY CATH 14FR (SET/KITS/TRAYS/PACK) IMPLANT

## 2014-11-13 NOTE — Anesthesia Procedure Notes (Signed)
Spinal Patient location during procedure: OR Start time: 11/13/2014 2:14 PM End time: 11/13/2014 2:17 PM Staffing Anesthesiologist: Leilani AbleHATCHETT, Daman Steffenhagen Performed by: anesthesiologist  Preanesthetic Checklist Completed: patient identified, surgical consent, pre-op evaluation, timeout performed, IV checked, risks and benefits discussed and monitors and equipment checked Spinal Block Patient position: sitting Prep: site prepped and draped and DuraPrep Patient monitoring: heart rate, cardiac monitor, continuous pulse ox and blood pressure Approach: midline Location: L3-4 Injection technique: single-shot Needle Needle type: Pencan  Needle gauge: 24 G Needle length: 9 cm Needle insertion depth: 6 cm Assessment Sensory level: T4

## 2014-11-13 NOTE — Lactation Note (Signed)
This note was copied from the chart of Renee GrenadaBrittany Barren. Lactation Consultation Note  Patient Name: Renee Melven SartoriusBrittany Wassink Today's Date: 11/13/2014 Reason for consult: Initial assessment;Breast/nipple pain (tender nipples) This is mom's first baby and she has latched for several feedings but has some superficial bruising in center of both nipples, with (R) feeling "more sore" per mom.  Nipples are everted and baby is cuing to feed.  Mom wants to offer less sore (L) breast and prefers across her lap, so LC suggests across-the-lap position and assists mom and baby with hand expression of a large drop on nipple, then demonstrates breast support and nipple tilt.  Baby grasps areola well with widely flanged lips and rhythmical sucking for >10 minutes.  PGM at bedside awaiting return of FOB later this evening.  Mom states that nipple is comfortable at this feeding.  LC encouraged cue feedings, STS and hand expression for nipple care.  Mom brought her own lanolin but LC cautioned use of lanolin but ensure careful handwashing and sparing use of lanolin.  Option of comfort gelpads offered (mom to ask for these from her nurse, if desires).LC encouraged review of Baby and Me pp 9, 14 and 20-25 for STS and BF information. LC provided [facility] Resource brochure and reviewed [facility] and list of community and web site resources. Mom encouraged to feed baby 8-12 times/24 hours and with feeding cues.    Maternal Data Formula Feeding for Exclusion: No Has patient been taught Hand Expression?: Yes (LC demonstrated and large drop of colostrum) Does the patient have breastfeeding experience prior to this delivery?: No  Feeding Feeding Type: Breast Fed Length of feed:  (sustained latch >10 minutes)  LATCH Score/Interventions Latch: Grasps breast easily, tongue down, lips flanged, rhythmical sucking.  Audible Swallowing: Spontaneous and intermittent Intervention(s): Skin to skin Intervention(s): Skin to skin;Hand  expression;Alternate breast massage  Type of Nipple: Everted at rest and after stimulation  Comfort (Breast/Nipple): Filling, red/small blisters or bruises, mild/mod discomfort  Problem noted: Mild/Moderate discomfort (no soreness on (L) with this feeding) Interventions (Mild/moderate discomfort): Hand expression  Hold (Positioning): Assistance needed to correctly position infant at breast and maintain latch. Intervention(s): Breastfeeding basics reviewed;Support Pillows;Position options;Skin to skin  LATCH Score: 8 (LC assisted and observed)  Lactation Tools Discussed/Used   STS, cue feedings, hand expression Nipple care  Consult Status Consult Status: Follow-up Date: 11/14/14 Follow-up type: In-patient    Warrick ParisianBryant, Dorella Laster [facility] 11/13/2014, 9:08 PM

## 2014-11-13 NOTE — Transfer of Care (Signed)
Immediate Anesthesia Transfer of Care Note  Patient: Renee Shaw  Procedure(s) Performed: Procedure(s) with comments: Primary CESAREAN SECTION (N/A) - EDD: 11/16/14  Patient Location: PACU  Anesthesia Type:Spinal  Level of Consciousness: awake  Airway & Oxygen Therapy: Patient Spontanous Breathing  Post-op Assessment: Report given to PACU RN  Post vital signs: Reviewed and stable  Complications: No apparent anesthesia complications

## 2014-11-13 NOTE — Brief Op Note (Signed)
11/13/2014  3:27 PM  PATIENT:  Renee Shaw  29 y.o. female  PRE-OPERATIVE DIAGNOSIS:  Breech  POST-OPERATIVE DIAGNOSIS:  Breech,  macrosomia  PROCEDURE:  Procedure(s) with comments: Primary CESAREAN SECTION (N/A) - EDD: 11/16/14  2 layer closure  SURGEON:  Surgeon(s) and Role:    Tresa Endo* Chidiebere Wynn A. Ernestina PennaFogleman, MD - Primary  PHYSICIAN ASSISTANT:   ASSISTANTSCarloyn Jaeger: R. Dawson, CNM   ANESTHESIA:   spinal  EBL:  Total I/O In: 3400 [I.V.:3400] Out: 1200 [Urine:300; Blood:900]  BLOOD ADMINISTERED:none  DRAINS: Urinary Catheter (Foley)   LOCAL MEDICATIONS USED:  NONE  SPECIMEN:  Source of Specimen:  placenta  DISPOSITION OF SPECIMEN:  L&D  COUNTS:  YES  TOURNIQUET:  * No tourniquets in log *  DICTATION: .Note written in EPIC  PLAN OF CARE: Admit to inpatient   PATIENT DISPOSITION:  PACU - hemodynamically stable.   Delay start of Pharmacological VTE agent (>24hrs) due to surgical blood loss or risk of bleeding: yes

## 2014-11-13 NOTE — Consult Note (Signed)
Neonatology Note:   Attendance at C-section:    I was asked by Dr. Ernestina PennaFogleman to attend this primary C/S at term due to breech presentation. The mother is a G1P0 O neg, GBS neg with known LGA infant. She had an abnormal 1 hour Glucola, ut a normal 3 hour test. ROM at delivery, fluid clear. Frank breech. CAN times 1 loosely. Infant vigorous with good spontaneous cry and tone. Needed only minimal bulb suctioning. Ap 9/10. Lungs clear to ausc in DR. To CN to care of Pediatrician. Prenatal ultrasound showed fetal pyelectasis.   Doretha Souhristie C. Nastashia Gallo, MD

## 2014-11-13 NOTE — Anesthesia Postprocedure Evaluation (Signed)
Anesthesia Post Note  Patient: Renee Shaw  Procedure(s) Performed: Procedure(s) (LRB): Primary CESAREAN SECTION (N/A)  Anesthesia type: Spinal  Patient location: PACU  Post pain: Pain level controlled  Post assessment: Post-op Vital signs reviewed  Last Vitals:  Filed Vitals:   11/13/14 1700  BP: 123/68  Pulse: 71  Temp:   Resp: 21    Post vital signs: Reviewed  Level of consciousness: awake  Complications: No apparent anesthesia complications

## 2014-11-13 NOTE — H&P (Signed)
Renee MessierBrittany M Shaw is Shaw 29 y.o. G1P0 at 7449w4d presenting for PCS. Pt notes occ contractions. Good fetal movement, No vaginal bleeding, not leaking fluid.  [facility] - Dated by LMP  C/w 6 wk u/s - Persistent breech presentation, declined version as low risk of success - LGA- 8'11 at 38 wks - Elevated wt gain- 42# - Rh neg, s/p Rhogam - b/l renal pylectasis- 6mm - ? Chronic htn, borderline bps mid 3rd trimester with neg PIH labs.   Prenatal Transfer Tool  Maternal Diabetes: No Genetic Screening: Declined Maternal Ultrasounds/Referrals: Normal Fetal Ultrasounds or other Referrals:  Other:  Maternal Substance Abuse:  No Significant Maternal Medications:  None Significant Maternal Lab Results: None     OB History    Gravida Para Term Preterm AB TAB SAB Ectopic Multiple Living   1              Past Medical History  Diagnosis Date  . CHICKENPOX, HX OF   . Migraine headache with aura   . Allergy   . Anxiety   . Heartburn in pregnancy   . Restless leg syndrome    Past Surgical History  Procedure Laterality Date  . Mouth surgery      cyst removed x 3   Family History: family history includes Cancer in her maternal grandfather and paternal grandmother; Diabetes in her father; Heart attack (age of onset: 6030) in her father; Migraines in her maternal grandmother. Social History:  reports that she quit smoking about 8 months ago. Her smoking use included Cigarettes. She has Shaw 5 pack-year smoking history. She has never used smokeless tobacco. She reports that she does not drink alcohol or use illicit drugs.  Review of Systems - Negative except discomfort of preg     Blood pressure 138/94, pulse 100, temperature 98.1 F (36.7 C), temperature source Oral, resp. rate 18, last menstrual period 02/09/2014.  Physical Exam:  Gen: well appearing, no distress Back: no CVAT Abd: gravid, NT, no RUQ pain LE: trace edema, equal bilaterally, non-tender RTUS:  breech, grossly nl AFi  Prenatal labs: ABO, Rh: --/--/O NEG (01/25 1215) Antibody: PENDING (01/25 1215) Rubella:  immune RPR: Non Reactive (01/21 1534)  HBsAg: Negative (06/16 0000)  HIV: Non-reactive (06/16 0000)  GBS:   neg 1 hr Glucola 159, nl 3 hr  Genetic screening declined Anatomy US normal   Assessment/Plan: 29 y.o. G1P0 at 6849w4d PCS for persistent breech presentation. R/B d/w pt who agrees to proceed Borderline bp, no evidence PEC, watch bp's PP Fetal renal pyelectasis. Alert peds   Renee Shaw. 11/13/2014, 2:03 PM

## 2014-11-13 NOTE — Anesthesia Preprocedure Evaluation (Addendum)
Anesthesia Evaluation  Patient identified by MRN, date of birth, ID band Patient awake    Reviewed: Allergy & Precautions, H&P , NPO status , Patient's Chart, lab work & pertinent test results  Airway Mallampati: I  TM Distance: >3 FB Neck ROM: full    Dental no notable dental hx.    Pulmonary neg pulmonary ROS, former smoker,    Pulmonary exam normal       Cardiovascular negative cardio ROS      Neuro/Psych negative psych ROS   GI/Hepatic negative GI ROS, Neg liver ROS,   Endo/Other  negative endocrine ROS  Renal/GU negative Renal ROS     Musculoskeletal   Abdominal Normal abdominal exam  (+)   Peds  Hematology negative hematology ROS (+)   Anesthesia Other Findings   Reproductive/Obstetrics (+) Pregnancy                            Anesthesia Physical Anesthesia Plan  ASA: II  Anesthesia Plan: Spinal   Post-op Pain Management:    Induction:   Airway Management Planned:   Additional Equipment:   Intra-op Plan:   Post-operative Plan:   Informed Consent: I have reviewed the patients History and Physical, chart, labs and discussed the procedure including the risks, benefits and alternatives for the proposed anesthesia with the patient or authorized representative who has indicated his/her understanding and acceptance.     Plan Discussed with: CRNA and Surgeon  Anesthesia Plan Comments:        Anesthesia Quick Evaluation

## 2014-11-13 NOTE — Op Note (Signed)
11/13/2014  3:27 PM  PATIENT:  Renee Shaw  29 y.o. female  PRE-OPERATIVE DIAGNOSIS:  Breech  POST-OPERATIVE DIAGNOSIS:  Breech,  macrosomia  PROCEDURE:  Procedure(s) with comments: Primary CESAREAN SECTION (N/A) - EDD: 11/16/14  2 layer closure  SURGEON:  Surgeon(s) and Role:    Tresa Endo A. Ernestina Penna, MD - Primary  PHYSICIAN ASSISTANT:   ASSISTANTSCarloyn Jaeger, CNM   ANESTHESIA:   spinal  EBL:  Total I/O In: 3400 [I.V.:3400] Out: 1200 [Urine:300; Blood:900]  BLOOD ADMINISTERED:none  DRAINS: Urinary Catheter (Foley)   LOCAL MEDICATIONS USED:  NONE  SPECIMEN:  Source of Specimen:  placenta  DISPOSITION OF SPECIMEN:  L&D  COUNTS:  YES  TOURNIQUET:  * No tourniquets in log *  DICTATION: .Note written in EPIC  PLAN OF CARE: Admit to inpatient   PATIENT DISPOSITION:  PACU - hemodynamically stable.   Delay start of Pharmacological VTE agent (>24hrs) due to surgical blood loss or risk of bleeding: yes   Findings:  @ infant,  APGAR (1 MIN): 9   APGAR (5 MINS): 10   APGAR (10 MINS):   Normal uterus, tubes and ovaries, normal placenta. 3VC, clear amniotic fluid, Frank breech  EBL: 900 cc Antibiotics:   2g Ancef Complications: none  Indications: This is a 29 y.o. year-old, G1  At [redacted]w[redacted]d admitted for [facility] for breech. Risks benefits and alternatives of the procedure were discussed with the patient who agreed to proceed  Procedure:  After informed consent was obtained the patient was taken to the operating room where spinal anesthesia was initiated.  She was prepped and draped in the normal sterile fashion in dorsal supine position with a leftward tilt.  A foley catheter was in place.  A Pfannenstiel skin incision was made 2 cm above the pubic symphysis in the midline with the scalpel.  Dissection was carried down with the Bovie cautery until the fascia was reached. The fascia was incised in the midline. The incision was extended laterally with the Mayo  scissors. The inferior aspect of the fascial incision was grasped with the Coker clamps, elevated up and the underlying rectus muscles were dissected off sharply. The superior aspect of the fascial incision was grasped with the Coker clamps elevated up and the underlying rectus muscles were dissected off sharply.  The peritoneum was entered bluntly. The peritoneal incision was extended superiorly and inferiorly with good visualization of the bladder. The bladder blade was inserted and palpation was done to assess the fetal position and the location of the uterine vessels. The lower segment of the uterus was incised sharply with the scalpel and extended  bluntly in the cephalo-caudal fashion. The infants sacrum was grasped, brought to the incision, the left then right leg were delivered in flexion. The infant was rotated and the right then left shoulders were delivered. Nuchal cord x 1 was reduced and the head was delivered in flexion. The nose and mouth were bulb suctioned. The cord was clamped and cut. The infant was handed off to the waiting pediatrician. The placenta was expressed. The uterus was exteriorized. The uterus was cleared of all clots and debris. The uterine incision was repaired with 0 Vicryl in a running locked fashion.  A second layer of the same suture was used in an imbricating fashion to obtain excellent hemostasis.  The uterus was then returned to the abdomen, the gutters were cleared of all clots and debris. The uterine incision was reinspected and found to be hemostatic. The peritoneum was  grasped and closed with 2-0 Vicryl in a running fashion. The cut muscle edges and the underside of the fascia were inspected and found to be hemostatic. The fascia was closed with 0 Vicryl in two halves . The subcutaneous tissue was irrigated. Scarpa's layer was closed with a 2-0 plain gut suture. The skin was closed with a 4-0 Monocryl in a single layer. The patient tolerated the procedure well. Sponge lap  and needle counts were correct x3 and patient was taken to the recovery room in a stable condition.  Teige Rountree A. 11/13/2014 3:29 PM

## 2014-11-14 ENCOUNTER — Encounter (HOSPITAL_COMMUNITY): Payer: Self-pay | Admitting: Obstetrics

## 2014-11-14 DIAGNOSIS — O321XX0 Maternal care for breech presentation, not applicable or unspecified: Secondary | ICD-10-CM | POA: Diagnosis present

## 2014-11-14 LAB — CBC
HCT: 28.5 % — ABNORMAL LOW (ref 36.0–46.0)
Hemoglobin: 9.5 g/dL — ABNORMAL LOW (ref 12.0–15.0)
MCH: 30.1 pg (ref 26.0–34.0)
MCHC: 33.3 g/dL (ref 30.0–36.0)
MCV: 90.2 fL (ref 78.0–100.0)
Platelets: 155 10*3/uL (ref 150–400)
RBC: 3.16 MIL/uL — AB (ref 3.87–5.11)
RDW: 13.4 % (ref 11.5–15.5)
WBC: 9.4 10*3/uL (ref 4.0–10.5)

## 2014-11-14 LAB — BIRTH TISSUE RECOVERY COLLECTION (PLACENTA DONATION)

## 2014-11-14 MED ORDER — POLYSACCHARIDE IRON COMPLEX 150 MG PO CAPS
150.0000 mg | ORAL_CAPSULE | Freq: Two times a day (BID) | ORAL | Status: DC
  Administered 2014-11-14 – 2014-11-15 (×3): 150 mg via ORAL
  Filled 2014-11-14 (×3): qty 1

## 2014-11-14 MED ORDER — MAGNESIUM OXIDE 400 (241.3 MG) MG PO TABS
200.0000 mg | ORAL_TABLET | Freq: Every day | ORAL | Status: DC
  Administered 2014-11-14 – 2014-11-15 (×2): 200 mg via ORAL
  Filled 2014-11-14 (×3): qty 0.5

## 2014-11-14 NOTE — Addendum Note (Signed)
Addendum  created 11/14/14 0854 by Algis GreenhouseLinda A Henrik Orihuela, CRNA   Modules edited: Notes Section   Notes Section:  File: [id number]

## 2014-11-14 NOTE — Lactation Note (Signed)
This note was copied from the chart of Renee GrenadaBrittany Rorrer. Lactation Consultation Note  Patient Name: Renee Melven SartoriusBrittany Gjerde QIONG'EToday's Date: 11/14/2014 Reason for consult: Follow-up assessment  Baby 19 hours of life. Mom reports nipple soreness and she understands baby's palate is high according to last LC visit. Mom reports right nipple has been a more difficult latch and nipple is sore. Assisted mom to latch baby to right breast in football position. Enc mom to hand express colostrum prior to latching, and mom able to easily express drops. Baby opened wide and latched deeply, suckling rhythmically with a few swallows noted. Enc mom to hold baby in tight and make sure lower lip flanged outward. Mom reports increased comfort with this latch. Enc mom to use EBM on nipple after each time nursing in order to facilitate healing. Baby nursed for 15 minutes before taken from breast for circumcision. Discussed with MOB that baby may be sleepy for 4-6 hours after circumcision but to offer lots of STS and nurse with cues. Enc mom to position baby as demonstrated and to re-latch baby if he slips to tip of nipple. Enc mom to call for assistance with latching as needed.  Maternal Data    Feeding    LATCH Score/Interventions Latch: Grasps breast easily, tongue down, lips flanged, rhythmical sucking.  Audible Swallowing: A few with stimulation Intervention(s): Hand expression;Skin to skin  Type of Nipple: Everted at rest and after stimulation  Comfort (Breast/Nipple): Filling, red/small blisters or bruises, mild/mod discomfort  Problem noted: Mild/Moderate discomfort Interventions (Mild/moderate discomfort): Comfort gels  Hold (Positioning): Assistance needed to correctly position infant at breast and maintain latch. Intervention(s): Support Pillows;Breastfeeding basics reviewed;Position options  LATCH Score: 7  Lactation Tools Discussed/Used     Consult Status Consult Status: Follow-up Date:  11/15/14 Follow-up type: In-patient    Geralynn OchsWILLIARD, Kanasia Gayman 11/14/2014, 10:04 AM

## 2014-11-14 NOTE — Anesthesia Postprocedure Evaluation (Signed)
Anesthesia Post Note  Patient: Renee Shaw  Procedure(s) Performed: Procedure(s) (LRB): Primary CESAREAN SECTION (N/A)  Anesthesia type: Spinal  Patient location: Mother/Baby  Post pain: Pain level controlled  Post assessment: Post-op Vital signs reviewed  Last Vitals:  Filed Vitals:   11/14/14 0500  BP: 116/51  Pulse: 65  Temp: 36.9 C  Resp: 18    Post vital signs: Reviewed  Level of consciousness: awake  Complications: No apparent anesthesia complications

## 2014-11-14 NOTE — Progress Notes (Signed)
Clinical Social Work Department PSYCHOSOCIAL ASSESSMENT - MATERNAL/CHILD 11/14/2014  Patient:  Renee Shaw,Renee Shaw  Account Number:  [id number]  Admit Date:  11/13/2014  Childs Name:   Jonathon Asher   Clinical Social Worker:  Rohen Kimes, CLINICAL SOCIAL WORKER   Date/Time:  11/14/2014 12:30 PM  Date Referred:  11/13/2014   Referral source  Central Nursery     Referred reason  Depression/Anxiety   Other referral source:    I:  FAMILY / HOME ENVIRONMENT Child's legal guardian:  PARENT  Guardian - Name Guardian - Age Guardian - Address  Renee Shaw 28 [address] [place], [place] [address]  Renee Shaw  same as above   Other household support members/support persons Other support:   MOB reported that the MGM and the PGM are her primary supports. She endorsed strong support system.    II  PSYCHOSOCIAL DATA Information Source:  Patient Interview  Financial and Community Resources Employment:   Per MOB, she works from home.  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   Financial resources:  Private Insurance If Medicaid - County:  [place]  School / Grade:  N/A Maternity Care Coordinator / Child Services Coordination / Early Interventions:   None reported  Cultural issues impacting care:   None reported    III  STRENGTHS Strengths  Adequate Resources  Home prepared for Child (including basic supplies)  Supportive family/friends   Strength comment:    IV  RISK FACTORS AND CURRENT PROBLEMS Current Problem:  YES   Risk Factor & Current Problem Patient Issue Family Issue Risk Factor / Current Problem Comment  Mental Illness N N MOB presents with a history of anxiety in 2013.  MOB reported reduced anxiety and no panic attacks in more than 2 years. She denied symptoms during the pregnancy.    V  SOCIAL WORK ASSESSMENT CSW met with MOB due to maternal history of anxiety.  MOB was receptive to the visit  and appeared easily engaged.  She displayed a full range in affect, was in a pleasant mood, and interacted frequently with the baby.  MOB did not present with any acute mental health symptoms, and CSW did not note any anxious thought patterns during the visit.    MOB smiled as she expressed excitement secondary to the transition to motherhood and the arrival of the baby.  CSW continued to assist the MOB process her thoughts and feelings related to the role transition.  MOB denied low stress, and shared belief that it is an ideal situation for the arrival of the baby since they have positive support and she is able to work from home.  MOB discussed how the home is prepared and how she is looking forward to bringing him home and beginning the next phase of her life.    The FOB was not in the room at time of the assessment, and the MOB shared that he was at a doctor's appointment cue to uncontrolled blood sugars/diabetes.  She stated that he had been resistant to going to the doctor since he wanted to be at the hospital with them, but she shared that she wanted him to go while she was at the hospital since she has the support/assistance of nursing staff and she needs him to be healthy once she returns home.  MOB presented as appropriately concerned and nervous as she awaiting the outcome of the doctor's appointment, but stated that she is well adjusted to his   blood sugar crises and is not going to be concerned until she receives an update from him.  When asked how MOB was able to cope and not allow stress to overwhelm her she stated, "we've been through worse".  She discussed belief that since they have been through worse regarding the FOB's medical needs, she would be able to get through anything. CSW continued to provide supportive listening and validated her feelings about the FOB's health.   CSW directly inquired about history of anxiety.  MOB acknowledged history and stated that it was almost 3 years ago.   She shard belief that it was directly related to work stress since she was having migraines, and then started to become anxious that she was going to have a migraine at work.  She stated that when she worried, she would trigger a panic attack.  She shared that she was prescribed Xanax PRN, but stated that she has not had to take any medications in more than 2 years.  CSW noted that the MOB did not verbalize any anxious thought process and did not present with any behaviors associated with anxiety.  CSW shared observation with the MOB, and she shared belief that anxiety is no longer a presenting concern.  She did not endorse any anxious thoughts that are often common with transition to parenthood, but she acknowledged that it is common for anxiety to increase with the birth of a baby.MOB was receptive and engaged as CSW provided postpartum depression.  She denied questions or concerns, and agreed to contact her MD if she notes acute anxiety or symptoms of PPD.   MOB acknowledged ongoing availability of CSW.  She denied questions, concerns, and expressed appreciation for the visit.   No barriers to discharge.  VI SOCIAL WORK PLAN Social Work Plan  Patient/Family Education  No Further Intervention Required / No Barriers to Discharge   Type of pt/family education:   Postpartum depression   If child protective services report - county:   If child protective services report - date:   Information/referral to community resources comment:   No referrals needed.   Other social work plan:   CSW to follow up as needed or upon family request.     

## 2014-11-14 NOTE — Progress Notes (Addendum)
POD # 1  Subjective: Pt reports feeling well/ Pain controlled with Motrin Tolerating po/ Foley in place/ No n/v/ Flatus present Activity: ad lib Bleeding is light Newborn info:  Information for the patient's newborn:  Verneita GriffesSizemore, Boy Soniyah [[id number]][[id number]]  female   Circumcision: planning/ Feeding: breast   Objective:  VS:  Filed Vitals:   11/14/14 0015 11/14/14 0020 11/14/14 0025 11/14/14 0500  BP: 118/61 134/78 124/71 116/51  Pulse: 70 97 97 65  Temp: 99.2 F (37.3 C)   98.4 F (36.9 C)  TempSrc:      Resp: 20   18  SpO2: 99%   100%     I&O: Intake/Output      01/25 0701 - 01/26 0700 01/26 0701 - 01/27 0700   I.V. 3462.5    Total Intake 3462.5     Urine 1600    Blood 900    Total Output 2500     Net +962.5             Recent Labs  11/14/14 0605  WBC 9.4  HGB 9.5*  HCT 28.5*  PLT 155    Blood type: --/--/O NEG (01/25 1430) Rubella: Immune (06/16 0000)    Physical Exam:  General: alert and cooperative CV: Regular rate and rhythm Resp: CTA bilaterally Abdomen: soft, nontender, normal bowel sounds Incision: healing well, no drainage, no erythema, no hernia, no seroma, no swelling, well approximated with suture, honeycomb dsg c/d/i Uterine Fundus: firm, below umbilicus, nontender Lochia: minimal GU: Foley to SD, clear amber, abundant Ext: extremities normal, atraumatic, no cyanosis or edema and Homans sign is negative, no sign of DVT    Assessment: POD # 1/ G1P1001/ S/P C/Section d/t breech  IDA with compounding ABL anemia Rh negative-infant Rh neg Doing well  Plan: Ambulate Continue routine post op orders   Signed: Donette LarryBHAMBRI, Azrielle Springsteen, N, MSN, CNM 11/14/2014, 9:37 AM

## 2014-11-15 MED ORDER — MAGNESIUM OXIDE 400 (241.3 MG) MG PO TABS: 200.0000 mg | ORAL_TABLET | Freq: Every day | ORAL | Status: DC

## 2014-11-15 MED ORDER — POLYSACCHARIDE IRON COMPLEX 150 MG PO CAPS: 150.0000 mg | ORAL_CAPSULE | Freq: Two times a day (BID) | ORAL | Status: DC

## 2014-11-15 MED ORDER — OXYCODONE-ACETAMINOPHEN 5-325 MG PO TABS: 1.0000 | ORAL_TABLET | ORAL | Status: DC | PRN

## 2014-11-15 MED ORDER — IBUPROFEN 600 MG PO TABS: 600.0000 mg | ORAL_TABLET | Freq: Four times a day (QID) | ORAL | Status: DC | PRN

## 2014-11-15 NOTE — Discharge Summary (Signed)
POSTOPERATIVE DISCHARGE SUMMARY:  Patient ID: Renee Shaw MRN: [id number] DOB/AGE: [birth year] 29 y.o.  Admit date: 11/13/2014 Admission Diagnoses: Homero [facility]ellersFrank Breech / [facility]   Discharge date:   Discharge Diagnoses: S/P Primary C/S due to [facility] and LGA on 11/13/2014        Prenatal history: G1P1001   EDC : 11/16/2014, by Last Menstrual Period  [facility] since 7.[redacted] wks gestation. Primary provider : Dr. Ernestina PennaFogleman Prenatal course complicated by Mignon PineFrank Breech / LGA / Rh Negative  Prenatal Labs: ABO, Rh: --/--/O NEG (01/25 1430) / Rhophylac done at 28 wks, not indicated PP - baby Rh Negative Antibody: POS (01/25 1430) Rubella: Immune (06/16 0000)   RPR: Non Reactive (01/21 1534)  HBsAg: Negative (06/16 0000)  HIV: Non-reactive (06/16 0000)  GTT : Abnormal 1 hr GTT / Normal 3 hr GTT GBS:   Negative  Medical / Surgical History :  Past medical history:  Past Medical History  Diagnosis Date  . CHICKENPOX, HX OF   . Migraine headache with aura   . Allergy   . Anxiety   . Heartburn in pregnancy   . Restless leg syndrome   . Breech presentation delivered 11/13/2014  . Postpartum care following cesarean delivery (1/25) 11/13/2014    Past surgical history:  Past Surgical History  Procedure Laterality Date  . Mouth surgery      cyst removed x 3  . Cesarean section N/A 11/13/2014    Procedure: Primary CESAREAN SECTION;  Surgeon: Tresa EndoKelly A. Ernestina PennaFogleman, MD;  Location: [facility];  Service: Obstetrics;  Laterality: N/A;  EDD: 11/16/14     Allergies: Review of patient's allergies indicates no known allergies.   Intrapartum Course:  Admitted for scheduled cesarean delivery d/t frank breech presentation and  Large for gestational age / cesarean delivery by Dr. Ernestina PennaFogleman - see operative report for details  Physical Exam:   VSS: Blood pressure 116/85, pulse 84, temperature 97.6 F (36.4 C), temperature source Oral, resp. rate 18, last  menstrual period 02/09/2014, SpO2 100 %, currently breastfeeding.  LABS:  Recent Labs  11/14/14 0605  WBC 9.4  HGB 9.5*  PLT 155    Newborn Data Live born female  Birth Weight: 9 lb 7.3 oz (4290 g) APGAR: 9, 10  See operative report for further details  Home with mother.  Discharge Instructions:  Wound Care: keep clean and dry / remove honeycomb POD 7 Postpartum Instructions: [facility] discharge booklet - instructions reviewed Medications:    Medication List    TAKE these medications        amitriptyline 10 MG tablet  Commonly known as:  ELAVIL  Take 0.5-1 tablets (5-10 mg total) by mouth at bedtime.     ibuprofen 600 MG tablet  Commonly known as:  ADVIL,MOTRIN  Take 1 tablet (600 mg total) by mouth every 6 (six) hours as needed for mild pain.     iron polysaccharides 150 MG capsule  Commonly known as:  NIFEREX  Take 1 capsule (150 mg total) by mouth 2 (two) times daily.     magnesium oxide 400 (241.3 MG) MG tablet  Commonly known as:  MAG-OX  Take 0.5 tablets (200 mg total) by mouth daily.     oxyCODONE-acetaminophen 5-325 MG per tablet  Commonly known as:  PERCOCET/ROXICET  Take 1 tablet by mouth every 4 (four) hours as needed (for pain scale less than 7).     prenatal multivitamin Tabs tablet  Take 1 tablet  by mouth daily at 12 noon.     ranitidine 150 MG tablet  Commonly known as:  ZANTAC  Take 150 mg by mouth 2 (two) times daily.           Follow-up Information    Follow up with [facility] A[facility] MD. Schedule an appointment as soon as possible for a visit in 6 weeks.   Specialty:  Obstetrics and Gynecology   Why:  postpartum visit   Contact information:   [address] [place] [address] [phone]         Signed: Kenard Gower, MSN, CNM 11/15/2014, 2:56 PM

## 2014-11-15 NOTE — Lactation Note (Signed)
This note was copied from the chart of Renee GrenadaBrittany Roane. Lactation Consultation Note  Patient Name: Renee Melven SartoriusBrittany Shaw BJYNW'GToday's Date: 11/15/2014 Reason for consult: Follow-up assessment;Breast/nipple pain Mom crying this morning asking for formula. She reports her nipples are cracked and bleeding and baby is not satisfied at the breast. RN initiated #20 nipple shield last night, Mom does not report observing colostrum in the nipple shield. Mom has lots of colostrum with hand expression. LC notes positional stripes on both nipples with cracking present. Care for sore nipples reviewed with Mom, advised to apply EBM, comfort gels given with instructions. Mom agreed to latch baby with [facility] assist. Used #24 nipple shield on left breast to see if more comfortable for Mom. With sizing Mom is between 20-24 size. LC assisted Mom with positioning and obtaining good depth. PS=10 with initial latch but did improve to 2-3 with baby nursing, no bleeding at the end of the feeding, nipple was round. Scant colostrum observed in the nipple shield. On oral exam of baby, LC notes short frenulum and thick upper lip frenulum. Baby tucks his lips with initial latch but can keep them un-tucked with nursing once adjusted. Baby does demonstrate a chewing motion with suckling on LC finger and intermittently on Mom's breast. Used #20  Nipple shield on right breast to see if baby had more milk transfer. Demonstrated to parents how to pre-load with EBM. PS=10 initially but with nursing resolved to 3. Scant colostrum in nipple shield however with hand expression several drops of colostrum present. Baby seem satiated at the end of the feeding. Some swallows noted with stimulation during BF.  On review of chart. Baby has had greater than 3% weight loss in 24 hours with total weight loss around 6% at less than 36 hours of age. 3 voids in 42 hours/10 stools. Mom wants d/c today. Scheduled OP f/u with lactation for Tuesday, 11/21/14 at 2:30.  LC encouraged Mom to stay for assist with another feeding before d/c. LC left phone number for Mom to call. Mom does have DEBP at home.   Maternal Data    Feeding Feeding Type: Breast Fed Length of feed: 25 min  LATCH Score/Interventions Latch: Grasps breast easily, tongue down, lips flanged, rhythmical sucking. (using nipple shield, 24 on left, 20 on right)  Audible Swallowing: A few with stimulation (with nurisng on right breast using #20 nipple shield)  Type of Nipple: Everted at rest and after stimulation  Comfort (Breast/Nipple): Engorged, cracked, bleeding, large blisters, severe discomfort Problem noted: Cracked, bleeding, blisters, bruises Intervention(s): Expressed breast milk to nipple  Problem noted: Severe discomfort Interventions (Mild/moderate discomfort): Comfort gels  Hold (Positioning): Assistance needed to correctly position infant at breast and maintain latch. Intervention(s): Breastfeeding basics reviewed;Support Pillows;Position options;Skin to skin  LATCH Score: 6  Lactation Tools Discussed/Used Tools: Nipple Shields;Comfort gels Nipple shield size: 20;24   Consult Status Consult Status: Follow-up Date: 11/15/14 Follow-up type: In-patient    Renee Shaw, Renee Shaw 11/15/2014, 9:38 AM

## 2014-11-15 NOTE — Lactation Note (Signed)
This note was copied from the chart of Renee Shaw. Lactation Consultation Note  Patient Name: Renee Shaw ONGEX'BToday's Date: 11/15/2014 Reason for consult: Follow-up assessment;Breast/nipple pain Return visit to assist with latch before d/c today. Mom is able to apply nipple shield and latch baby with minimal assist. Reviewed bringing bottom lip down with nursing. Baby intermittently chews at the breast but after nursing for approx 10 minutes developed a more rhythmic suckling pattern. Mom using #20 nipple shield to latch baby, scant amount of colostrum visible in the nipple shield, baby fell asleep after nursing. Re-weighed baby due to weight loss, baby not at 7.1% weight loss in 47 hours, weight 8 lb. 12.4 oz/3985 gm.  Mom reported less discomfort with this feeding, however nipples are cracked, no bleeding observed and nipples round when baby came off the breast. Few swallows noted with baby at the breast but LC concerned about milk transfer with not observing but scant amount of colostrum in the nipple shield. Baby jittery at this visit with stimulation. RN made aware.  Gave Mom the following plan due to weight loss and nipple trauma, parents are also exhausted.  Mom to BF whenever baby is hungry but at least every 3 hours.  Pre-pump for 2-3 minutes to get flow of colostrum moving. Use #20 nipple shield to help with latch. Keep baby nursing for 15-20 minutes, both breasts each feeding. Post pump for 15 minutes after the feeding. Do not pump at night, just BF baby. FOB to supplement with EBM or formula, starting with 15 ml today after each feeding, then follow supplemental guidelines per hours of age. Keep OP f/u on Tuesday, 11/21/14 at 2:30.   Maternal Data    Feeding Feeding Type: Breast Fed Length of feed: 30 min  LATCH Score/Interventions Latch: Grasps breast easily, tongue down, lips flanged, rhythmical sucking. (using #20 nipple shield)  Audible Swallowing: A few with  stimulation  Type of Nipple: Everted at rest and after stimulation  Comfort (Breast/Nipple): Engorged, cracked, bleeding, large blisters, severe discomfort Problem noted: Cracked, bleeding, blisters, bruises  Problem noted: Mild/Moderate discomfort Interventions  (Cracked/bleeding/bruising/blister): Expressed breast milk to nipple Interventions (Mild/moderate discomfort): Comfort gels  Hold (Positioning): Assistance needed to correctly position infant at breast and maintain latch. Intervention(s): Breastfeeding basics reviewed;Support Pillows;Position options;Skin to skin  LATCH Score: 6  Lactation Tools Discussed/Used Tools: Nipple Dorris CarnesShields;Pump Nipple shield size: 20 Breast pump type: Manual   Consult Status Consult Status: Complete Date: 11/15/14 Follow-up type: In-patient    Alfred LevinsGranger, Zela Sobieski Ann 11/15/2014, 2:13 PM

## 2014-11-15 NOTE — Discharge Instructions (Signed)
Breast Pumping Tips °If you are breastfeeding, there may be times when you cannot feed your baby directly. Returning to work or going on a trip are common examples. Pumping allows you to store breast milk and feed it to your baby later.  °You may not get much milk when you first start to pump. Your breasts should start to make more after a few days. If you pump at the times you usually feed your baby, you may be able to keep making enough milk to feed your baby without also using formula. The more often you pump, the more milk you will produce.  °WHEN SHOULD I PUMP?  °· You can begin to pump soon after delivery. However, some experts recommend waiting about 4 weeks before giving your infant a bottle to make sure breastfeeding is going well.  °· If you plan to return to work, begin pumping a few weeks before. This will help you develop techniques that work best for you. It also lets you build up a supply of breast milk.   °· When you are with your infant, feed on demand and pump after each feeding.   °· When you are away from your infant for several hours, pump for about 15 minutes every 2-3 hours. Pump both breasts at the same time if you can.   °· If your infant has a formula feeding, make sure to pump around the same time.     °· If you drink any alcohol, wait 2 hours before pumping.   °HOW DO I PREPARE TO PUMP? °Your let-down reflex is the natural reaction to stimulation that makes your breast milk flow. It is easier to stimulate this reflex when you are relaxed. Find relaxation techniques that work for you. If you have difficulty with your let-down reflex, try these methods:  °· Smell one of your infant's blankets or an item of clothing.   °· Look at a picture or video of your infant.   °· Sit in a quiet, private space.   °· Massage the breast you plan to pump.   °· Place soothing warmth on the breast.   °· Play relaxing music.   °WHAT ARE SOME GENERAL BREAST PUMPING TIPS? °· Wash your hands before you pump. You  do not need to wash your nipples or breasts. °· There are three ways to pump. °¨ You can use your hand to massage and compress your breast. °¨ You can use a handheld manual pump. °¨ You can use an electric pump.   °· Make sure the suction cup (flange) on the breast pump is the right size. Place the flange directly over the nipple. If it is the wrong size or placed the wrong way, it may be painful and cause nipple damage.   °· If pumping is uncomfortable, apply a small amount of purified or modified lanolin to your nipple and areola. °· If you are using an electric pump, adjust the speed and suction power to be more comfortable. °· If pumping is painful or if you are not getting very much milk, you may need a different type of pump. A lactation consultant can help you determine what type of pump to use.   °· Keep a full water bottle near you at all times. Drinking lots of fluid helps you make more milk.  °· You can store your milk to use later. Pumped breast milk can be stored in a sealable, sterile container or plastic bag. Label all stored breast milk with the date you pumped it. °¨ Milk can stay out at room temperature for up to 8 hours. °¨   You can store your milk in the refrigerator for up to 8 days. °¨ You can store your milk in the freezer for 3 months. Thaw frozen milk using warm water. Do not put it in the microwave. °· Do not smoke. Smoking can lower your milk supply and harm your infant. If you need help quitting, ask your health care provider to recommend a program.   °WHEN SHOULD I CALL MY HEALTH CARE PROVIDER OR A LACTATION CONSULTANT? °· You are having trouble pumping. °· You are concerned that you are not making enough milk. °· You have nipple pain, soreness, or redness. °· You want to use birth control. Birth control pills may lower your milk supply. Talk to your health care provider about your options. °Document Released: 03/26/2010 Document Revised: 10/11/2013 Document Reviewed:  07/29/2013 °Exit[employer]® Patient Information ©2015 [employer]. This information is not intended to replace advice given to you by your health care provider. Make sure you discuss any questions you have with your health care provider. ° °Nutrition for the New Mother  °A new mother needs good health and nutrition so she can have energy to take care of a new baby. Whether a mother breastfeeds or formula feeds the baby, it is important to have a well-balanced diet. Foods from all the food groups should be chosen to meet the new mother's energy needs and to give her the nutrients needed for repair and healing.  °A HEALTHY EATING PLAN °The My [employer] plan for Moms outlines what you should eat to help you and your baby stay healthy. The energy and amount of food you need depends on whether or not you are breastfeeding. If you are breastfeeding you will need more nutrients. If you choose not to breastfeed, your nutrition goal should be to return to a healthy weight. Limiting calories may be needed if you are not breastfeeding.  °HOME CARE INSTRUCTIONS  °· For a personal plan based on your unique needs, see your Registered Dietitian or visit www.mypyramid.gov. °· Eat a variety of foods. The plan below will help guide you. The following chart has a suggested daily meal plan from the [employer] for Moms. °· Eat a variety of fruits and vegetables. °· Eat more dark green and orange vegetables and cooked dried beans. °· Make half your grains whole grains. Choose whole instead of refined grains. °· Choose low-fat or lean meats and poultry. °· Choose low-fat or fat-free dairy products like milk, cheese, or yogurt. °Fruits °· Breastfeeding: 2 cups °· Non-Breastfeeding: 2 cups °· What Counts as a serving? °¨ 1 cup of fruit or juice. °¨ ½ cup dried fruit. °Vegetables °· Breastfeeding: 3 cups °· Non-Breastfeeding: 2 ½ cups °· What Counts as a serving? °¨ 1 cup raw or cooked vegetables. °¨ Juice or 2 cups raw leafy  vegetables. °Grains °· Breastfeeding: 8 oz °· Non-Breastfeeding: 6 oz °· What Counts as a serving? °¨ 1 slice bread. °¨ 1 oz ready-to-eat cereal. °¨ ½ cup cooked pasta, rice, or cereal. °Meat and Beans °· Breastfeeding: 6 ½ oz °· Non-Breastfeeding: 5 ½ oz °· What Counts as a serving? °¨ 1 oz lean meat, poultry, or fish °¨ ¼ cup cooked dry beans °¨ ½ oz nuts or 1 egg °¨ 1 tbs peanut butter °Milk °· Breastfeeding: 3 cups °· Non-Breastfeeding: 3 cups °· What Counts as a serving? °¨ 1 cup milk. °¨ 8 oz yogurt. °¨ 1 ½ oz cheese. °¨ 2 oz processed cheese. °TIPS FOR THE BREASTFEEDING MOM °· Rapid weight   loss is not suggested when you are breastfeeding. By simply breastfeeding, you will be able to lose the weight gained during your pregnancy. Your caregiver can keep track of your weight and tell you if your weight loss is appropriate. °· Be sure to drink fluids. You may notice that you are thirstier than usual. A suggestion is to drink a glass of water or other beverage whenever you breastfeed. °· Avoid alcohol as it can be passed into your breast milk. °· Limit caffeine drinks to no more than 2 to 3 cups per day. °· You may need to keep taking your prenatal vitamin while you are breastfeeding. Talk with your caregiver about taking a vitamin or supplement. °RETURING TO A HEALTHY WEIGHT °· The My [employer] Plan for Moms will help you return to a healthy weight. It will also provide the nutrients you need. °· You may need to limit "empty" calories. These include: °¨ High fat foods like fried foods, fatty meats, fast food, butter, and mayonnaise. °¨ High sugar foods like sodas, jelly, candy, and sweets. °· Be physically active. Include 30 minutes of exercise or more each day. Choose an activity you like such as walking, swimming, biking, or aerobics. Check with your caregiver before you start to exercise. °Document Released: 01/13/2008 Document Revised: 12/29/2011 Document Reviewed: 01/13/2008 °ExitCare® Patient Information  ©2015 [employer]. This information is not intended to replace advice given to you by your health care provider. Make sure you discuss any questions you have with your health care provider. °Postpartum Depression and Baby Blues °The postpartum period begins right after the birth of a baby. During this time, there is often a great amount of joy and excitement. It is also a time of many changes in the life of the parents. Regardless of how many times a mother gives birth, each child brings new challenges and dynamics to the family. It is not unusual to have feelings of excitement along with confusing shifts in moods, emotions, and thoughts. All mothers are at risk of developing postpartum depression or the "baby blues." These mood changes can occur right after giving birth, or they may occur many months after giving birth. The baby blues or postpartum depression can be mild or severe. Additionally, postpartum depression can go away rather quickly, or it can be a long-term condition.  °CAUSES °Raised hormone levels and the rapid drop in those levels are thought to be a main cause of postpartum depression and the baby blues. A number of hormones change during and after pregnancy. Estrogen and progesterone usually decrease right after the delivery of your baby. The levels of thyroid hormone and various cortisol steroids also rapidly drop. Other factors that play a role in these mood changes include major life events and genetics.  °RISK FACTORS °If you have any of the following risks for the baby blues or postpartum depression, know what symptoms to watch out for during the postpartum period. Risk factors that may increase the likelihood of getting the baby blues or postpartum depression include: °· Having a personal or family history of depression.   °· Having depression while being pregnant.   °· Having premenstrual mood issues or mood issues related to oral contraceptives. °· Having a lot of life stress.   °· Having  marital conflict.   °· Lacking a social support network.   °· Having a baby with special needs.   °· Having health problems, such as diabetes.   °SIGNS AND SYMPTOMS °Symptoms of baby blues include: °· Brief changes in mood, such as going   from extreme happiness to sadness. °· Decreased concentration.   °· Difficulty sleeping.   °· Crying spells, tearfulness.   °· Irritability.   °· Anxiety.   °Symptoms of postpartum depression typically begin within the first month after giving birth. These symptoms include: °· Difficulty sleeping or excessive sleepiness.   °· Marked weight loss.   °· Agitation.   °· Feelings of worthlessness.   °· Lack of interest in activity or food.   °Postpartum psychosis is a very serious condition and can be dangerous. Fortunately, it is rare. Displaying any of the following symptoms is cause for immediate medical attention. Symptoms of postpartum psychosis include:  °· Hallucinations and delusions.   °· Bizarre or disorganized behavior.   °· Confusion or disorientation.   °DIAGNOSIS  °A diagnosis is made by an evaluation of your symptoms. There are no medical or lab tests that lead to a diagnosis, but there are various questionnaires that a health care provider may use to identify those with the baby blues, postpartum depression, or psychosis. Often, a screening tool called the Edinburgh Postnatal Depression Scale is used to diagnose depression in the postpartum period.  °TREATMENT °The baby blues usually goes away on its own in 1-2 weeks. Social support is often all that is needed. You will be encouraged to get adequate sleep and rest. Occasionally, you may be given medicines to help you sleep.  °Postpartum depression requires treatment because it can last several months or longer if it is not treated. Treatment may include individual or group therapy, medicine, or both to address any social, physiological, and psychological factors that may play a role in the depression. Regular exercise, a  healthy diet, rest, and social support may also be strongly recommended.  °Postpartum psychosis is more serious and needs treatment right away. Hospitalization is often needed. °HOME CARE INSTRUCTIONS °· Get as much rest as you can. Nap when the baby sleeps.   °· Exercise regularly. Some women find yoga and walking to be beneficial.   °· Eat a balanced and nourishing diet.   °· Do little things that you enjoy. Have a cup of tea, take a bubble bath, read your favorite magazine, or listen to your favorite music. °· Avoid alcohol.   °· Ask for help with household chores, cooking, grocery shopping, or running errands as needed. Do not try to do everything.   °· Talk to people close to you about how you are feeling. Get support from your partner, family members, friends, or other new moms. °· Try to stay positive in how you think. Think about the things you are grateful for.   °· Do not spend a lot of time alone.   °· Only take over-the-counter or prescription medicine as directed by your health care provider. °· Keep all your postpartum appointments.   °· Let your health care provider know if you have any concerns.   °SEEK MEDICAL CARE IF: °You are having a reaction to or problems with your medicine. °SEEK IMMEDIATE MEDICAL CARE IF: °· You have suicidal feelings.   °· You think you may harm the baby or someone else. °MAKE SURE YOU: °· Understand these instructions. °· Will watch your condition. °· Will get help right away if you are not doing well or get worse. °Document Released: 07/10/2004 Document Revised: 10/11/2013 Document Reviewed: 07/18/2013 °Exit[employer]® Patient Information ©2015 [employer]. This information is not intended to replace advice given to you by your health care provider. Make sure you discuss any questions you have with your health care provider. °Breastfeeding and Mastitis °Mastitis is inflammation of the breast tissue. It can occur in women who   are breastfeeding. This can make breastfeeding  painful. Mastitis will sometimes go away on its own. Your health care provider will help determine if treatment is needed. °CAUSES °Mastitis is often associated with a blocked milk (lactiferous) duct. This can happen when too much milk builds up in the breast. Causes of excess milk in the breast can include: °· Poor latch-on. If your baby is not latched onto the breast properly, she or he may not empty your breast completely while breastfeeding. °· Allowing too much time to pass between feedings. °· Wearing a bra or other clothing that is too tight. This puts extra pressure on the lactiferous ducts so milk does not flow through them as it should. °Mastitis can also be caused by a bacterial infection. Bacteria may enter the breast tissue through cuts or openings in the skin. In women who are breastfeeding, this may occur because of cracked or irritated skin. Cracks in the skin are often caused when your baby does not latch on properly to the breast. °SIGNS AND SYMPTOMS °· Swelling, redness, tenderness, and pain in an area of the breast. °· Swelling of the glands under the arm on the same side. °· Fever may or may not accompany mastitis. °If an infection is allowed to progress, a collection of pus (abscess) may develop. °DIAGNOSIS  °Your health care provider can usually diagnose mastitis based on your symptoms and a physical exam. Tests may be done to help confirm the diagnosis. These may include: °· Removal of pus from the breast by applying pressure to the area. This pus can be examined in the lab to determine which bacteria are present. If an abscess has developed, the fluid in the abscess can be removed with a needle. This can also be used to confirm the diagnosis and determine the bacteria present. In most cases, pus will not be present. °· Blood tests to determine if your body is fighting a bacterial infection. °· Mammogram or ultrasound tests to rule out other problems or diseases. °TREATMENT  °Mastitis that  occurs with breastfeeding will sometimes go away on its own. Your health care provider may choose to wait 24 hours after first seeing you to decide whether a prescription medicine is needed. If your symptoms are worse after 24 hours, your health care provider will likely prescribe an antibiotic medicine to treat the mastitis. He or she will determine which bacteria are most likely causing the infection and will then select an appropriate antibiotic medicine. This is sometimes changed based on the results of tests performed to identify the bacteria, or if there is no response to the antibiotic medicine selected. Antibiotic medicines are usually given by mouth. You may also be given medicine for pain. °HOME CARE INSTRUCTIONS °· Only take over-the-counter or prescription medicines for pain, fever, or discomfort as directed by your health care provider. °· If your health care provider prescribed an antibiotic medicine, take the medicine as directed. Make sure you finish it even if you start to feel better. °· Do not wear a tight or underwire bra. Wear a soft, supportive bra. °· Increase your fluid intake, especially if you have a fever. °· Continue to empty the breast. Your health care provider can tell you whether this milk is safe for your infant or needs to be thrown out. You may be told to stop nursing until your health care provider thinks it is safe for your baby. Use a breast pump if you are advised to stop nursing. °· Keep your nipples   clean and dry. °· Empty the first breast completely before going to the other breast. If your baby is not emptying your breasts completely for some reason, use a breast pump to empty your breasts. °· If you go back to work, pump your breasts while at work to stay in time with your nursing schedule. °· Avoid allowing your breasts to become overly filled with milk (engorged). °SEEK MEDICAL CARE IF: °· You have pus-like discharge from the breast. °· Your symptoms do not improve with  the treatment prescribed by your health care provider within 2 days. °SEEK IMMEDIATE MEDICAL CARE IF: °· Your pain and swelling are getting worse. °· You have pain that is not controlled with medicine. °· You have a red line extending from the breast toward your armpit. °· You have a fever or persistent symptoms for more than 2-3 days. °· You have a fever and your symptoms suddenly get worse. °MAKE SURE YOU:  °· Understand these instructions. °· Will watch your condition. °· Will get help right away if you are not doing well or get worse. °Document Released: 01/31/2005 Document Revised: 10/11/2013 Document Reviewed: 05/12/2013 °[employer]® Patient Information ©2015 [employer]. This information is not intended to replace advice given to you by your health care provider. Make sure you discuss any questions you have with your health care provider. °Breastfeeding °Deciding to breastfeed is one of the best choices you can make for you and your baby. A change in hormones during pregnancy causes your breast tissue to grow and increases the number and size of your milk ducts. These hormones also allow proteins, sugars, and fats from your blood supply to make breast milk in your milk-producing glands. Hormones prevent breast milk from being released before your baby is born as well as prompt milk flow after birth. Once breastfeeding has begun, thoughts of your baby, as well as his or her sucking or crying, can stimulate the release of milk from your milk-producing glands.  °BENEFITS OF BREASTFEEDING °For Your Baby °· Your first milk (colostrum) helps your baby's digestive system function better.   °· There are antibodies in your milk that help your baby fight off infections.   °· Your baby has a lower incidence of asthma, allergies, and sudden infant death syndrome.   °· The nutrients in breast milk are better for your baby than infant formulas and are designed uniquely for your baby's needs.   °· Breast milk improves your  baby's brain development.   °· Your baby is less likely to develop other conditions, such as childhood obesity, asthma, or type 2 diabetes mellitus.   °For You  °· Breastfeeding helps to create a very special bond between you and your baby.   °· Breastfeeding is convenient. Breast milk is always available at the correct temperature and costs nothing.   °· Breastfeeding helps to burn calories and helps you lose the weight gained during pregnancy.   °· Breastfeeding makes your uterus contract to its prepregnancy size faster and slows bleeding (lochia) after you give birth.   °· Breastfeeding helps to lower your risk of developing type 2 diabetes mellitus, osteoporosis, and breast or ovarian cancer later in life. °SIGNS THAT YOUR BABY IS HUNGRY °Early Signs of Hunger  °· Increased alertness or activity. °· Stretching. °· Movement of the head from side to side. °· Movement of the head and opening of the mouth when the corner of the mouth or cheek is stroked (rooting). °· Increased sucking sounds, smacking lips, cooing, sighing, or squeaking. °· Hand-to-mouth movements. °· Increased sucking of   fingers or hands. °Late Signs of Hunger °· Fussing. °· Intermittent crying. °Extreme Signs of Hunger °Signs of extreme hunger will require calming and consoling before your baby will be able to breastfeed successfully. Do not wait for the following signs of extreme hunger to occur before you initiate breastfeeding:   °· Restlessness. °· A loud, strong cry. °·  Screaming. °BREASTFEEDING BASICS °Breastfeeding Initiation °· Find a comfortable place to sit or lie down, with your neck and back well supported. °· Place a pillow or rolled up blanket under your baby to bring him or her to the level of your breast (if you are seated). Nursing pillows are specially designed to help support your arms and your baby while you breastfeed. °· Make sure that your baby's abdomen is facing your abdomen.   °· Gently massage your breast. With your  fingertips, massage from your chest wall toward your nipple in a circular motion. This encourages milk flow. You may need to continue this action during the feeding if your milk flows slowly. °· Support your breast with 4 fingers underneath and your thumb above your nipple. Make sure your fingers are well away from your nipple and your baby's mouth.   °· Stroke your baby's lips gently with your finger or nipple.   °· When your baby's mouth is open wide enough, quickly bring your baby to your breast, placing your entire nipple and as much of the colored area around your nipple (areola) as possible into your baby's mouth.   °¨ More areola should be visible above your baby's upper lip than below the lower lip.   °¨ Your baby's tongue should be between his or her lower gum and your breast.   °· Ensure that your baby's mouth is correctly positioned around your nipple (latched). Your baby's lips should create a seal on your breast and be turned out (everted). °· It is common for your baby to suck about 2-3 minutes in order to start the flow of breast milk. °Latching °Teaching your baby how to latch on to your breast properly is very important. An improper latch can cause nipple pain and decreased milk supply for you and poor weight gain in your baby. Also, if your baby is not latched onto your nipple properly, he or she may swallow some air during feeding. This can make your baby fussy. Burping your baby when you switch breasts during the feeding can help to get rid of the air. However, teaching your baby to latch on properly is still the best way to prevent fussiness from swallowing air while breastfeeding. °Signs that your baby has successfully latched on to your nipple:    °· Silent tugging or silent sucking, without causing you pain.   °· Swallowing heard between every 3-4 sucks.   °·  Muscle movement above and in front of his or her ears while sucking.   °Signs that your baby has not successfully latched on to  nipple:  °· Sucking sounds or smacking sounds from your baby while breastfeeding. °· Nipple pain. °If you think your baby has not latched on correctly, slip your finger into the corner of your baby's mouth to break the suction and place it between your baby's gums. Attempt breastfeeding initiation again. °Signs of Successful Breastfeeding °Signs from your baby:   °· A gradual decrease in the number of sucks or complete cessation of sucking.   °· Falling asleep.   °· Relaxation of his or her body.   °· Retention of a small amount of milk in his or her mouth.   °· Letting go   of your breast by himself or herself. °Signs from you: °· Breasts that have increased in firmness, weight, and size 1-3 hours after feeding.   °· Breasts that are softer immediately after breastfeeding. °· Increased milk volume, as well as a change in milk consistency and color by the fifth day of breastfeeding.   °· Nipples that are not sore, cracked, or bleeding. °Signs That Your Baby is Getting Enough Milk °· Wetting at least 3 diapers in a 24-hour period. The urine should be clear and pale yellow by age 5 days. °· At least 3 stools in a 24-hour period by age 5 days. The stool should be soft and yellow. °· At least 3 stools in a 24-hour period by age 7 days. The stool should be seedy and yellow. °· No loss of weight greater than 10% of birth weight during the first 3 days of age. °· Average weight gain of 4-7 ounces (113-198 g) per week after age 4 days. °· Consistent daily weight gain by age 5 days, without weight loss after the age of 2 weeks. °After a feeding, your baby may spit up a small amount. This is common. °BREASTFEEDING FREQUENCY AND DURATION °Frequent feeding will help you make more milk and can prevent sore nipples and breast engorgement. Breastfeed when you feel the need to reduce the fullness of your breasts or when your baby shows signs of hunger. This is called "breastfeeding on demand." Avoid introducing a pacifier to your  baby while you are working to establish breastfeeding (the first 4-6 weeks after your baby is born). After this time you may choose to use a pacifier. Research has shown that pacifier use during the first year of a baby's life decreases the risk of sudden infant death syndrome (SIDS). °Allow your baby to feed on each breast as long as he or she wants. Breastfeed until your baby is finished feeding. When your baby unlatches or falls asleep while feeding from the first breast, offer the second breast. Because newborns are often sleepy in the first few weeks of life, you may need to awaken your baby to get him or her to feed. °Breastfeeding times will vary from baby to baby. However, the following rules can serve as a guide to help you ensure that your baby is properly fed: °· Newborns (babies 4 weeks of age or younger) may breastfeed every 1-3 hours. °· Newborns should not go longer than 3 hours during the day or 5 hours during the night without breastfeeding. °· You should breastfeed your baby a minimum of 8 times in a 24-hour period until you begin to introduce solid foods to your baby at around 6 months of age. °BREAST MILK PUMPING °Pumping and storing breast milk allows you to ensure that your baby is exclusively fed your breast milk, even at times when you are unable to breastfeed. This is especially important if you are going back to work while you are still breastfeeding or when you are not able to be present during feedings. Your lactation consultant can give you guidelines on how long it is safe to store breast milk.  °A breast pump is a machine that allows you to pump milk from your breast into a sterile bottle. The pumped breast milk can then be stored in a refrigerator or freezer. Some breast pumps are operated by hand, while others use electricity. Ask your lactation consultant which type will work best for you. Breast pumps can be purchased, but some hospitals and breastfeeding support groups   lease  breast pumps on a monthly basis. A lactation consultant can teach you how to hand express breast milk, if you prefer not to use a pump.  °CARING FOR YOUR BREASTS WHILE YOU BREASTFEED °Nipples can become dry, cracked, and sore while breastfeeding. The following recommendations can help keep your breasts moisturized and healthy: °· Avoid using soap on your nipples.   °· Wear a supportive bra. Although not required, special nursing bras and tank tops are designed to allow access to your breasts for breastfeeding without taking off your entire bra or top. Avoid wearing underwire-style bras or extremely tight bras. °· Air dry your nipples for 3-4 minutes after each feeding.   °· Use only cotton bra pads to absorb leaked breast milk. Leaking of breast milk between feedings is normal.   °· Use lanolin on your nipples after breastfeeding. Lanolin helps to maintain your skin's normal moisture barrier. If you use pure lanolin, you do not need to wash it off before feeding your baby again. Pure lanolin is not toxic to your baby. You may also hand express a few drops of breast milk and gently massage that milk into your nipples and allow the milk to air dry. °In the first few weeks after giving birth, some women experience extremely full breasts (engorgement). Engorgement can make your breasts feel heavy, warm, and tender to the touch. Engorgement peaks within 3-5 days after you give birth. The following recommendations can help ease engorgement: °· Completely empty your breasts while breastfeeding or pumping. You may want to start by applying warm, moist heat (in the shower or with warm water-soaked hand towels) just before feeding or pumping. This increases circulation and helps the milk flow. If your baby does not completely empty your breasts while breastfeeding, pump any extra milk after he or she is finished. °· Wear a snug bra (nursing or regular) or tank top for 1-2 days to signal your body to slightly decrease milk  production. °· Apply ice packs to your breasts, unless this is too uncomfortable for you. °· Make sure that your baby is latched on and positioned properly while breastfeeding. °If engorgement persists after 48 hours of following these recommendations, contact your health care provider or a lactation consultant. °OVERALL HEALTH CARE RECOMMENDATIONS WHILE BREASTFEEDING °· Eat healthy foods. Alternate between meals and snacks, eating 3 of each per day. Because what you eat affects your breast milk, some of the foods may make your baby more irritable than usual. Avoid eating these foods if you are sure that they are negatively affecting your baby. °· Drink milk, fruit juice, and water to satisfy your thirst (about 10 glasses a day).   °· Rest often, relax, and continue to take your prenatal vitamins to prevent fatigue, stress, and anemia. °· Continue breast self-awareness checks. °· Avoid chewing and smoking tobacco. °· Avoid alcohol and drug use. °Some medicines that may be harmful to your baby can pass through breast milk. It is important to ask your health care provider before taking any medicine, including all over-the-counter and prescription medicine as well as vitamin and herbal supplements. °It is possible to become pregnant while breastfeeding. If birth control is desired, ask your health care provider about options that will be safe for your baby. °SEEK MEDICAL CARE IF:  °· You feel like you want to stop breastfeeding or have become frustrated with breastfeeding. °· You have painful breasts or nipples. °· Your nipples are cracked or bleeding. °· Your breasts are red, tender, or warm. °· You have   a swollen area on either breast. °· You have a fever or chills. °· You have nausea or vomiting. °· You have drainage other than breast milk from your nipples. °· Your breasts do not become full before feedings by the fifth day after you give birth. °· You feel sad and depressed. °· Your baby is too sleepy to eat  well. °· Your baby is having trouble sleeping.   °· Your baby is wetting less than 3 diapers in a 24-hour period. °· Your baby has less than 3 stools in a 24-hour period. °· Your baby's skin or the white part of his or her eyes becomes yellow.   °· Your baby is not gaining weight by 5 days of age. °SEEK IMMEDIATE MEDICAL CARE IF:  °· Your baby is overly tired (lethargic) and does not want to wake up and feed. °· Your baby develops an unexplained fever. °Document Released: 10/06/2005 Document Revised: 10/11/2013 Document Reviewed: 03/30/2013 °[employer]® Patient Information ©2015 [employer]. This information is not intended to replace advice given to you by your health care provider. Make sure you discuss any questions you have with your health care provider. ° °

## 2014-11-15 NOTE — Progress Notes (Signed)
Patient ID: Renee MessierBrittany Renee Shaw Shaw, female   DOB: 1986/09/08, 29 y.o.   MRN: [id number] Subjective: POD# 2 Information for the patient's newborn:  Renee Shaw, Renee Shaw [[id number]]  female  / circ done  Reports feeling well, ready for an early discharge Feeding: breast Patient reports tolerating PO.  Breast symptoms: mild compression trauma - wearing nipple shields to correct Pain controlled with ibuprofen (OTC) Denies HA/SOB/C/P/N/V/dizziness. Flatus present. No BM. She reports vaginal bleeding as normal, without clots.  She is ambulating, urinating without difficult.     Objective:   VS:  Filed Vitals:   11/14/14 0025 11/14/14 0500 11/14/14 1734 11/15/14 0520  BP: 124/71 116/51 124/65 116/85  Pulse: 97 65 81 84  Temp:  98.4 F (36.9 C) 98 F (36.7 C) 97.6 F (36.4 C)  TempSrc:   Oral Oral  Resp:  18 18 18   SpO2:  100%      No intake or output data in the 24 hours ending 11/15/14 1447      Recent Labs  11/14/14 0605  WBC 9.4  HGB 9.5*  HCT 28.5*  PLT 155     Blood type: O NEG (01/25 1430) - baby Rh Neg - Rhophylac not indicated  Rubella: Immune (06/16 0000)     Physical Exam:  General: alert, cooperative, no distress and moderately obese CV: Regular rate and rhythm, S1S2 present or without murmur or extra heart sounds Resp: clear Abdomen: soft, nontender, normal bowel sounds Incision: Tegaderm and Honeycomb C/D/I - very, small spot of dried bloody drainage on LT side of Honeycomb; skin well-approximated with sutures Uterine Fundus: firm, 2 FB below umbilicus, nontender Lochia: minimal Ext: extremities normal, atraumatic, no cyanosis or edema, Homans sign is negative, no sign of DVT and no edema, redness or tenderness in the calves or thighs   Assessment/Plan: 29 y.o.   POD# 2.  s/p Cesarean Delivery.  Indications: breech and LGA                Principal Problem:   Postpartum care following cesarean delivery (1/25) Active Problems:   Breech presentation  delivered     Doing well, stable.               Regular diet as tolerated Ambulate Routine post-op care Early Discharge home today  Kenard GowerDAWSON, Renee Shaw, M, MSN, CNM 11/15/2014, 2:47 PM

## 2014-11-16 LAB — TYPE AND SCREEN
ABO/RH(D): O NEG
Antibody Screen: POSITIVE
DAT, IgG: NEGATIVE
UNIT DIVISION: 0
Unit division: 0

## 2014-11-21 ENCOUNTER — Ambulatory Visit (HOSPITAL_COMMUNITY)
Admit: 2014-11-21 | Discharge: 2014-11-21 | Disposition: A | Discharge status: Home / Self Care | Payer: 59 | Attending: Obstetrics | Admitting: Obstetrics

## 2014-11-26 ENCOUNTER — Encounter (HOSPITAL_COMMUNITY): Payer: Self-pay

## 2014-11-26 ENCOUNTER — Inpatient Hospital Stay (HOSPITAL_COMMUNITY)
Admission: AD | Admit: 2014-11-26 | Discharge: 2014-11-26 | Disposition: A | Discharge status: Home / Self Care | Payer: 59 | Source: Ambulatory Visit | Attending: Obstetrics | Admitting: Obstetrics

## 2014-11-26 DIAGNOSIS — R030 Elevated blood-pressure reading, without diagnosis of hypertension: Secondary | ICD-10-CM

## 2014-11-26 DIAGNOSIS — IMO0001 Reserved for inherently not codable concepts without codable children: Secondary | ICD-10-CM | POA: Clinically undetermined

## 2014-11-26 DIAGNOSIS — Z87891 Personal history of nicotine dependence: Secondary | ICD-10-CM | POA: Insufficient documentation

## 2014-11-26 DIAGNOSIS — O9089 Other complications of the puerperium, not elsewhere classified: Secondary | ICD-10-CM | POA: Insufficient documentation

## 2014-11-26 DIAGNOSIS — I158 Other secondary hypertension: Secondary | ICD-10-CM | POA: Diagnosis not present

## 2014-11-26 DIAGNOSIS — R51 Headache: Secondary | ICD-10-CM | POA: Diagnosis not present

## 2014-11-26 DIAGNOSIS — R519 Headache, unspecified: Secondary | ICD-10-CM | POA: Diagnosis present

## 2014-11-26 HISTORY — DX: Reserved for inherently not codable concepts without codable children: IMO0001

## 2014-11-26 HISTORY — DX: Elevated blood-pressure reading, without diagnosis of hypertension: R03.0

## 2014-11-26 HISTORY — DX: Headache: R51

## 2014-11-26 HISTORY — DX: Headache, unspecified: R51.9

## 2014-11-26 LAB — COMPREHENSIVE METABOLIC PANEL
ALBUMIN: 3.8 g/dL (ref 3.5–5.2)
ALT: 27 U/L (ref 0–35)
AST: 22 U/L (ref 0–37)
Alkaline Phosphatase: 91 U/L (ref 39–117)
Anion gap: 5 (ref 5–15)
BUN: 10 mg/dL (ref 6–23)
CO2: 26 mmol/L (ref 19–32)
Calcium: 9.3 mg/dL (ref 8.4–10.5)
Chloride: 108 mmol/L (ref 96–112)
Creatinine, Ser: 0.77 mg/dL (ref 0.50–1.10)
GFR calc Af Amer: >90 mL/min (ref >90)
GFR calc non Af Amer: >90 mL/min (ref >90)
Glucose, Bld: 96 mg/dL (ref 70–99)
POTASSIUM: 4.1 mmol/L (ref 3.5–5.1)
Sodium: 139 mmol/L (ref 135–145)
TOTAL PROTEIN: 6.9 g/dL (ref 6.0–8.3)
Total Bilirubin: 0.5 mg/dL (ref 0.3–1.2)

## 2014-11-26 LAB — CBC
HCT: 36.3 % (ref 36.0–46.0)
HEMOGLOBIN: 11.8 g/dL — AB (ref 12.0–15.0)
MCH: 29.6 pg (ref 26.0–34.0)
MCHC: 32.5 g/dL (ref 30.0–36.0)
MCV: 91 fL (ref 78.0–100.0)
Platelets: 293 10*3/uL (ref 150–400)
RBC: 3.99 MIL/uL (ref 3.87–5.11)
RDW: 14.3 % (ref 11.5–15.5)
WBC: 6.3 10*3/uL (ref 4.0–10.5)

## 2014-11-26 LAB — URIC ACID: URIC ACID, SERUM: 5.1 mg/dL (ref 2.4–7.0)

## 2014-11-26 MED ORDER — ACETAMINOPHEN 500 MG PO TABS
1000.0000 mg | ORAL_TABLET | Freq: Once | ORAL | Status: AC
  Administered 2014-11-26: 1000 mg via ORAL
  Filled 2014-11-26: qty 2

## 2014-11-26 NOTE — Discharge Instructions (Signed)
Hypertension During Pregnancy °Hypertension, or high blood pressure, is when there is extra pressure inside your blood vessels that carry blood from the heart to the rest of your body (arteries). It can happen at any time in life, including pregnancy. Hypertension during pregnancy can cause problems for you and your baby. Your baby might not weigh as much as he or she should at birth or might be born early (premature). Very bad cases of hypertension during pregnancy can be life-threatening.  °Different types of hypertension can occur during pregnancy. These include: °· Chronic hypertension. This happens when a woman has hypertension before pregnancy and it continues during pregnancy. °· Gestational hypertension. This is when hypertension develops during pregnancy. °· Preeclampsia or toxemia of pregnancy. This is a very serious type of hypertension that develops only during pregnancy. It affects the whole body and can be very dangerous for both mother and baby.   °Gestational hypertension and preeclampsia usually go away after your baby is born. Your blood pressure will likely stabilize within 6 weeks. Women who have hypertension during pregnancy have a greater chance of developing hypertension later in life or with future pregnancies. °RISK FACTORS °There are certain factors that make it more likely for you to develop hypertension during pregnancy. These include: °· Having hypertension before pregnancy. °· Having hypertension during a previous pregnancy. °· Being overweight. °· Being older than 40 years. °· Being pregnant with more than one baby. °· Having diabetes or kidney problems. °SIGNS AND SYMPTOMS °Chronic and gestational hypertension rarely cause symptoms. Preeclampsia has symptoms, which may include: °· Increased protein in your urine. Your health care provider will check for this at every prenatal visit. °· Swelling of your hands and face. °· Rapid weight gain. °· Headaches. °· Visual changes. °· Being  bothered by light. °· Abdominal pain, especially in the upper right area. °· Chest pain. °· Shortness of breath. °· Increased reflexes. °· Seizures. These occur with a more severe form of preeclampsia, called eclampsia. °DIAGNOSIS  °You may be diagnosed with hypertension during a regular prenatal exam. At each prenatal visit, you may have: °· Your blood pressure checked. °· A urine test to check for protein in your urine. °The type of hypertension you are diagnosed with depends on when you developed it. It also depends on your specific blood pressure reading. °· Developing hypertension before 20 weeks of pregnancy is consistent with chronic hypertension. °· Developing hypertension after 20 weeks of pregnancy is consistent with gestational hypertension. °· Hypertension with increased urinary protein is diagnosed as preeclampsia. °· Blood pressure measurements that stay above 160 systolic or 110 diastolic are a sign of severe preeclampsia. °TREATMENT °Treatment for hypertension during pregnancy varies. Treatment depends on the type of hypertension and how serious it is. °· If you take medicine for chronic hypertension, you may need to switch medicines. °¨ Medicines called ACE inhibitors should not be taken during pregnancy. °¨ Low-dose aspirin may be suggested for women who have risk factors for preeclampsia. °· If you have gestational hypertension, you may need to take a blood pressure medicine that is safe during pregnancy. Your health care provider will recommend the correct medicine. °· If you have severe preeclampsia, you may need to be in the hospital. Health care providers will watch you and your baby very closely. You also may need to take medicine called magnesium sulfate to prevent seizures and lower blood pressure. °· Sometimes, an early delivery is needed. This may be the case if the condition worsens. It would be   done to protect you and your baby. The only cure for preeclampsia is delivery.  Your health  care provider may recommend that you take one low-dose aspirin (81 mg) each day to help prevent high blood pressure during your pregnancy if you are at risk for preeclampsia. You may be at risk for preeclampsia if:  You had preeclampsia or eclampsia during a previous pregnancy.  Your baby did not grow as expected during a previous pregnancy.  You experienced preterm birth with a previous pregnancy.  You experienced a separation of the placenta from the uterus (placental abruption) during a previous pregnancy.  You experienced the loss of your baby during a previous pregnancy.  You are pregnant with more than one baby.  You have other medical conditions, such as diabetes or an autoimmune disease. HOME CARE INSTRUCTIONS  Schedule and keep all of your regular prenatal care appointments. This is important.  Take medicines only as directed by your health care provider. Tell your health care provider about all medicines you take.  Eat as little salt as possible.  Get regular exercise.  Do not drink alcohol.  Do not use tobacco products.  Do not drink products with caffeine.  Lie on your left side when resting. SEEK IMMEDIATE MEDICAL CARE IF:  You have severe abdominal pain.  You have sudden swelling in your hands, ankles, or face.  You gain 4 pounds (1.8 kg) or more in 1 week.  You vomit repeatedly.  You have vaginal bleeding.  You do not feel your baby moving as much.  You have a headache.  You have blurred or double vision.  You have muscle twitching or spasms.  You have shortness of breath.  You have blue fingernails or lips.  You have blood in your urine. MAKE SURE YOU:  Understand these instructions.  Will watch your condition.  Will get help right away if you are not doing well or get worse. Document Released: 06/24/2011 Document Revised: 02/20/2014 Document Reviewed: 05/05/2013 [facility] Patient Information 2015 [place], [place]. This information is not  intended to replace advice given to you by your health care provider. Make sure you discuss any questions you have with your health care provider. General Headache Without Cause A headache is pain or discomfort felt around the head or neck area. The specific cause of a headache may not be found. There are many causes and types of headaches. A few common ones are:  Tension headaches.  Migraine headaches.  Cluster headaches.  Chronic daily headaches. HOME CARE INSTRUCTIONS   Keep all follow-up appointments with your caregiver or any specialist referral.  Only take over-the-counter or prescription medicines for pain or discomfort as directed by your caregiver.  Lie down in a dark, quiet room when you have a headache.  Keep a headache journal to find out what may trigger your migraine headaches. For example, write down:  What you eat and drink.  How much sleep you get.  Any change to your diet or medicines.  Try massage or other relaxation techniques.  Put ice packs or heat on the head and neck. Use these 3 to 4 times per day for 15 to 20 minutes each time, or as needed.  Limit stress.  Sit up straight, and do not tense your muscles.  Quit smoking if you smoke.  Limit alcohol use.  Decrease the amount of caffeine you drink, or stop drinking caffeine.  Eat and sleep on a regular schedule.  Get 7 to 9 hours of sleep,  or as recommended by your caregiver.  Keep lights dim if bright lights bother you and make your headaches worse. SEEK MEDICAL CARE IF:   You have problems with the medicines you were prescribed.  Your medicines are not working.  You have a change from the usual headache.  You have nausea or vomiting. SEEK IMMEDIATE MEDICAL CARE IF:   Your headache becomes severe.  You have a fever.  You have a stiff neck.  You have loss of vision.  You have muscular weakness or loss of muscle control.  You start losing your balance or have trouble  walking.  You feel faint or pass out.  You have severe symptoms that are different from your first symptoms. MAKE SURE YOU:   Understand these instructions.  Will watch your condition.  Will get help right away if you are not doing well or get worse. Document Released: 10/06/2005 Document Revised: 12/29/2011 Document Reviewed: 10/22/2011 [facility] Patient Information 2015 [place], [place]. This information is not intended to replace advice given to you by your health care provider. Make sure you discuss any questions you have with your health care provider.

## 2014-11-26 NOTE — MAU Provider Note (Signed)
History     [name]: [id number]  Arrival date and time: 11/26/14 1329 Provider on unit: 1200 Provider at bedside: 1400   No chief complaint on file.  HPI  [name] is a 29 yo G1P1001 who is 2 weeks postpartum presenting with complaints of ? Yellow drainage from RT edge of incision and persistent headache x 2 days.  She reports that her h/a has not been relieved with ibuprofen; last dose at 0700 this AM.  She delivered via cesarean delivery d/t breech presentation and LGA.  Her primary OB provider at [facility] is Dr. Ernestina Shaw.  Past Medical History  Diagnosis Date  . CHICKENPOX, HX OF   . Migraine headache with aura   . Allergy   . Anxiety   . Heartburn in pregnancy   . Restless leg syndrome   . Breech presentation delivered 11/13/2014  . Postpartum care following cesarean delivery (1/25) 11/13/2014  . Headache on top of head 11/26/2014  . Elevated blood pressure 11/26/2014    Past Surgical History  Procedure Laterality Date  . Mouth surgery      cyst removed x 3  . Cesarean section N/A 11/13/2014    Procedure: Primary CESAREAN SECTION;  Surgeon: Renee Endo A. Renee Penna, MD;  Location: [facility];  Service: Obstetrics;  Laterality: N/A;  EDD: 11/16/14    Family History  Problem Relation Age of Onset  . Diabetes Father   . Heart attack Father 30    x6  . Migraines Maternal Grandmother   . Cancer Maternal Grandfather     liver  . Cancer Paternal Grandmother     lung    History  Substance Use Topics  . Smoking status: Former Smoker -- 0.50 packs/day for 10 years    Types: Cigarettes    Quit date: 02/17/2014  . Smokeless tobacco: Never Used     Comment: works for [employer]  . Alcohol Use: No    Allergies: No Known Allergies  Prescriptions prior to admission  Medication Sig Dispense Refill Last Dose  . ibuprofen (ADVIL,MOTRIN) 600 MG tablet Take 1 tablet (600 mg total) by mouth every 6 (six) hours as needed for mild pain. 30 tablet 0 11/26/2014 at Unknown time   . iron polysaccharides (NIFEREX) 150 MG capsule Take 1 capsule (150 mg total) by mouth 2 (two) times daily. 60 capsule 3 11/26/2014 at Unknown time  . magnesium oxide (MAG-OX) 400 (241.3 MG) MG tablet Take 0.5 tablets (200 mg total) by mouth daily. 30 tablet 3 11/26/2014 at Unknown time  . Prenatal Vit-Fe Fumarate-FA (PRENATAL MULTIVITAMIN) TABS tablet Take 1 tablet by mouth daily at 12 noon.   Past Week at Unknown time  . amitriptyline (ELAVIL) 10 MG tablet Take 0.5-1 tablets (5-10 mg total) by mouth at bedtime. (Patient not taking: Reported on 11/06/2014) 30 tablet 1 Not Taking at Unknown time  . oxyCODONE-acetaminophen (PERCOCET/ROXICET) 5-325 MG per tablet Take 1 tablet by mouth every 4 (four) hours as needed (for pain scale less than 7). (Patient not taking: Reported on 11/26/2014) 30 tablet 0     Review of Systems  Constitutional: Negative.   HENT:       H/A at top of head x 2 days  Eyes: Positive for photophobia.  Respiratory: Negative.   Cardiovascular: Negative.   Gastrointestinal: Negative.   Genitourinary: Negative.   Musculoskeletal: Negative.   Skin:       Concerned about RT edge of C/S incision - ? Yellow drainage from RT edge of incision  Neurological: Positive for headaches.  Endo/Heme/Allergies: Negative.   Psychiatric/Behavioral: Negative.    Results for orders placed or performed during the hospital encounter of 11/26/14 (from the past 24 hour(s))  CBC     Status: Abnormal   Collection Time: 11/26/14  1:50 PM  Result Value Ref Range   WBC 6.3 4.0 - 10.5 K/uL   RBC 3.99 3.87 - 5.11 MIL/uL   Hemoglobin 11.8 (L) 12.0 - 15.0 g/dL   HCT 16.1 09.6 - 04.5 %   MCV 91.0 78.0 - 100.0 fL   MCH 29.6 26.0 - 34.0 pg   MCHC 32.5 30.0 - 36.0 g/dL   RDW 40.9 81.1 - 91.4 %   Platelets 293 150 - 400 K/uL  Comprehensive metabolic panel     Status: None   Collection Time: 11/26/14  1:50 PM  Result Value Ref Range   Sodium 139 135 - 145 mmol/L   Potassium 4.1 3.5 - 5.1 mmol/L    Chloride 108 96 - 112 mmol/L   CO2 26 19 - 32 mmol/L   Glucose, Bld 96 70 - 99 mg/dL   BUN 10 6 - 23 mg/dL   Creatinine, Ser 7.82 0.50 - 1.10 mg/dL   Calcium 9.3 8.4 - 95.6 mg/dL   Total Protein 6.9 6.0 - 8.3 g/dL   Albumin 3.8 3.5 - 5.2 g/dL   AST 22 0 - 37 U/L   ALT 27 0 - 35 U/L   Alkaline Phosphatase 91 39 - 117 U/L   Total Bilirubin 0.5 0.3 - 1.2 mg/dL   GFR calc non Af Amer >90 >90 mL/min   GFR calc Af Amer >90 >90 mL/min   Anion gap 5 5 - 15  Uric acid     Status: None   Collection Time: 11/26/14  1:50 PM  Result Value Ref Range   Uric Acid, Serum 5.1 2.4 - 7.0 mg/dL   Physical Exam   Blood pressure 147/90, pulse 66, temperature 97.9 F (36.6 C), resp. rate 16, height  (1.727 m), weight 94.802 kg (209 lb), last menstrual period 02/09/2014, currently breastfeeding.  Physical Exam  Constitutional: She is oriented to person, place, and time. She appears well-developed and well-nourished.  HENT:  Head: Normocephalic and atraumatic.  Eyes: Pupils are equal, round, and reactive to light.  Neck: Normal range of motion.  Cardiovascular: Normal rate, regular rhythm, normal heart sounds and intact distal pulses.   Respiratory: Effort normal and breath sounds normal.  GI: Soft. Bowel sounds are normal.  Genitourinary:  Pelvic exam deferred  Musculoskeletal: Normal range of motion.  Neurological: She is alert and oriented to person, place, and time. She has normal reflexes.  Skin: Skin is warm and dry.  C/S incision healing well; skin temp normal; no erythema, ecchymosis; pin-point size cream colored drainage on the RT end of incision - no drainage otherwise  Psychiatric: She has a normal mood and affect. Her behavior is normal. Judgment and thought content normal.    MAU Course  Procedures PIH labs Tylenol 1000 mg po   Assessment and Plan  S/P Cesarean Delivery for Breech & LGA Postpartum Hypertension vs PEC (postpartum) Headache - improved after Tylenol / no PEC  sx's at time of d/c  Discharge Home Maintain good hydration / drink at least 2-3 liters of water daily Tylenol 1000 mg po every 6-8 hours prn H/A Call to schedule BP re-check on Wednesday 2/10 PEC precautions reviewed  *Dr. Ernestina Shaw notified of assessment and plan - agrees  Raelyn MoraAWSON, Taydem Cavagnaro, M MSN, CNM 11/26/2014, 2:49 PM

## 2014-11-26 NOTE — MAU Note (Signed)
Pt presents to MAU with complaints of a yellowish discharge at her incision site from her cesarean section on January the 25th. Reports a low grade fever

## 2015-05-23 ENCOUNTER — Emergency Department (HOSPITAL_COMMUNITY): Payer: No Typology Code available for payment source

## 2015-05-23 ENCOUNTER — Encounter (HOSPITAL_COMMUNITY): Payer: Self-pay | Admitting: Family Medicine

## 2015-05-23 ENCOUNTER — Emergency Department (HOSPITAL_COMMUNITY)
Admission: EM | Admit: 2015-05-23 | Discharge: 2015-05-23 | Disposition: A | Discharge status: Home / Self Care | Payer: No Typology Code available for payment source | Attending: Emergency Medicine | Admitting: Emergency Medicine

## 2015-05-23 DIAGNOSIS — Y92410 Unspecified street and highway as the place of occurrence of the external cause: Secondary | ICD-10-CM | POA: Diagnosis not present

## 2015-05-23 DIAGNOSIS — S199XXA Unspecified injury of neck, initial encounter: Secondary | ICD-10-CM | POA: Insufficient documentation

## 2015-05-23 DIAGNOSIS — Z8679 Personal history of other diseases of the circulatory system: Secondary | ICD-10-CM | POA: Diagnosis not present

## 2015-05-23 DIAGNOSIS — Z8659 Personal history of other mental and behavioral disorders: Secondary | ICD-10-CM | POA: Insufficient documentation

## 2015-05-23 DIAGNOSIS — Y9389 Activity, other specified: Secondary | ICD-10-CM | POA: Diagnosis not present

## 2015-05-23 DIAGNOSIS — S0993XA Unspecified injury of face, initial encounter: Secondary | ICD-10-CM | POA: Insufficient documentation

## 2015-05-23 DIAGNOSIS — Z87891 Personal history of nicotine dependence: Secondary | ICD-10-CM | POA: Diagnosis not present

## 2015-05-23 DIAGNOSIS — Z8669 Personal history of other diseases of the nervous system and sense organs: Secondary | ICD-10-CM | POA: Insufficient documentation

## 2015-05-23 DIAGNOSIS — Z79899 Other long term (current) drug therapy: Secondary | ICD-10-CM | POA: Insufficient documentation

## 2015-05-23 DIAGNOSIS — R52 Pain, unspecified: Secondary | ICD-10-CM

## 2015-05-23 DIAGNOSIS — Y999 Unspecified external cause status: Secondary | ICD-10-CM | POA: Diagnosis not present

## 2015-05-23 DIAGNOSIS — M7918 Myalgia, other site: Secondary | ICD-10-CM

## 2015-05-23 DIAGNOSIS — Z8619 Personal history of other infectious and parasitic diseases: Secondary | ICD-10-CM | POA: Diagnosis not present

## 2015-05-23 DIAGNOSIS — S299XXA Unspecified injury of thorax, initial encounter: Secondary | ICD-10-CM | POA: Diagnosis not present

## 2015-05-23 MED ORDER — HYDROCODONE-ACETAMINOPHEN 5-325 MG PO TABS
ORAL_TABLET | ORAL | Status: DC
Start: 2015-05-23 — End: 2021-01-10
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
## 2015-05-23 NOTE — Discharge Instructions (Signed)
Take vicodin for breakthrough pain, do not drink alcohol, drive, care for children or do other critical tasks while taking vicodin. ° °Please follow with your primary care doctor in the next 2 days for a check-up. They must obtain records for further management.  ° °Do not hesitate to return to the Emergency Department for any new, worsening or concerning symptoms.  ° °

## 2015-05-23 NOTE — ED Notes (Signed)
Pt given ice pack to apply to her face.

## 2016-06-25 ENCOUNTER — Emergency Department (HOSPITAL_COMMUNITY)
Admission: EM | Admit: 2016-06-25 | Discharge: 2016-06-25 | Disposition: A | Discharge status: Home / Self Care | Payer: 59 | Attending: Physician Assistant | Admitting: Physician Assistant

## 2016-06-25 ENCOUNTER — Encounter (HOSPITAL_COMMUNITY): Payer: Self-pay | Admitting: Emergency Medicine

## 2016-06-25 ENCOUNTER — Emergency Department (HOSPITAL_COMMUNITY): Payer: 59

## 2016-06-25 DIAGNOSIS — Z87891 Personal history of nicotine dependence: Secondary | ICD-10-CM | POA: Diagnosis not present

## 2016-06-25 DIAGNOSIS — K047 Periapical abscess without sinus: Secondary | ICD-10-CM

## 2016-06-25 DIAGNOSIS — R51 Headache: Secondary | ICD-10-CM | POA: Diagnosis present

## 2016-06-25 DIAGNOSIS — K046 Periapical abscess with sinus: Secondary | ICD-10-CM | POA: Diagnosis not present

## 2016-06-25 MED ORDER — CLINDAMYCIN HCL 150 MG PO CAPS: 150.0000 mg | ORAL_CAPSULE | Freq: Four times a day (QID) | ORAL | 0 refills | Status: DC

## 2016-06-25 NOTE — ED Triage Notes (Signed)
Pt sts right sided facial pain into head; pt sts has cyst in her gum that may be causing pain

## 2016-06-25 NOTE — ED Provider Notes (Signed)
[facility]-EMERGENCY DEPT Provider Note   [name]: [id number] Arrival date & time: 06/25/16  1118    By signing my name below, I, Renee Shaw, attest that this documentation has been prepared under the direction and in the presence of Teressa LowerVrinda Aidian Salomon, NP. Electronically Signed: Sonum Shaw, Neurosurgeoncribe. 06/25/16. 11:50 AM.  History   Chief Complaint Chief Complaint  Patient presents with  . Facial Pain    The history is provided by the patient. No language interpreter was used.     HPI Comments: Renee MessierBrittany M Shaw is a 30 y.o. female who presents to the Emergency Department complaining of persistent, gradually worsened right sided facial pain with associated minimal swelling that has been ongoing for the last several days. She was seen by her PCP who started her on Flonase and Augmentin for sinus related illness. She was later seen at an UC who changed her medication to prednisone and Levaquin. She describes her pain as a pressure-like sensation and states it is alleviated by applied pressure to the face and head. She reports a history of a right upper dental cyst which she had drained a few times. She eventually had a root canal to the affected tooth and is unsure if her current symptoms are related. She has taken ibuprofen without significant relief. She denies fever.   Past Medical History:  Diagnosis Date  . Allergy   . Anxiety   . Breech presentation delivered 11/13/2014  . CHICKENPOX, HX OF   . Elevated blood pressure 11/26/2014  . Headache on top of head 11/26/2014  . Heartburn in pregnancy   . Migraine headache with aura   . Postpartum care following cesarean delivery (1/25) 11/13/2014  . Restless leg syndrome     Patient Active Problem List   Diagnosis Date Noted  . Headache on top of head 11/26/2014  . Elevated blood pressure 11/26/2014  . Breech birth 11/14/2014  . Breech presentation delivered 11/13/2014  . Postpartum care following cesarean delivery (1/25) 11/13/2014  . Anxiety     . Migraine headache with aura 03/18/2012  . CHICKENPOX, HX OF 11/23/2009    Past Surgical History:  Procedure Laterality Date  . CESAREAN SECTION N/A 11/13/2014   Procedure: Primary CESAREAN SECTION;  Surgeon: Tresa EndoKelly A. Ernestina PennaFogleman, MD;  Location: [facility];  Service: Obstetrics;  Laterality: N/A;  EDD: 11/16/14  . MOUTH SURGERY     cyst removed x 3    OB History    Gravida Para Term Preterm AB Living   1 1 1     1    SAB TAB Ectopic Multiple Live Births         0 1       Home Medications    Prior to Admission medications   Medication Sig Start Date End Date Taking? Authorizing Provider  HYDROcodone-acetaminophen (NORCO/VICODIN) 5-325 MG per tablet Take 1-2 tablets by mouth every 6 hours as needed for pain and/or cough. 05/23/15   Nicole Pisciotta, PA-C  ibuprofen (ADVIL,MOTRIN) 600 MG tablet Take 1 tablet (600 mg total) by mouth every 6 (six) hours as needed for mild pain. 11/15/14   Raelyn Moraolitta Dawson, CNM  iron polysaccharides (NIFEREX) 150 MG capsule Take 1 capsule (150 mg total) by mouth 2 (two) times daily. 11/15/14   Raelyn Moraolitta Dawson, CNM  magnesium oxide (MAG-OX) 400 (241.3 MG) MG tablet Take 0.5 tablets (200 mg total) by mouth daily. 11/15/14   Raelyn Moraolitta Dawson, CNM  Prenatal Vit-Fe Fumarate-FA (PRENATAL MULTIVITAMIN) TABS tablet Take 1 tablet by mouth daily at  12 noon.    Historical Provider, MD    Family History Family History  Problem Relation Age of Onset  . Diabetes Father   . Heart attack Father 30    x6  . Migraines Maternal Grandmother   . Cancer Maternal Grandfather     liver  . Cancer Paternal Grandmother     lung    Social History Social History  Substance Use Topics  . Smoking status: Former Smoker    Packs/day: 0.50    Years: 10.00    Types: Cigarettes    Quit date: 02/17/2014  . Smokeless tobacco: Never Used     Comment: works for [employer]  . Alcohol use No     Allergies   Review of patient's allergies indicates no known  allergies.   Review of Systems Review of Systems  Constitutional: Negative for fever.  HENT: Positive for facial swelling.        +facial pain  All other systems reviewed and are negative.    Physical Exam Updated Vital Signs BP 147/88 (BP Location: Right Arm)   Pulse 75   Temp 98.2 F (36.8 C) (Oral)   Resp 18   SpO2 100%   Physical Exam  Constitutional: She is oriented to person, place, and time. She appears well-developed and well-nourished. No distress.  HENT:  Head: Normocephalic and atraumatic.  Right Ear: External ear normal.  Left Ear: External ear normal.  Mouth/Throat: Oropharynx is clear and moist.  Left frontal sinus tenderness.   Eyes: Conjunctivae and EOM are normal.  Neck: Neck supple. No tracheal deviation present.  Cardiovascular: Normal rate.   Pulmonary/Chest: Effort normal. No respiratory distress.  Musculoskeletal: Normal range of motion.  Neurological: She is alert and oriented to person, place, and time.  Skin: Skin is warm and dry.  Psychiatric: She has a normal mood and affect. Her behavior is normal.  Nursing note and vitals reviewed.    ED Treatments / Results  DIAGNOSTIC STUDIES: Oxygen Saturation is 100% on RA, normal by my interpretation.    COORDINATION OF CARE: 11:53 AM Discussed treatment plan with pt at bedside and pt agreed to plan.   Labs (all labs ordered are listed, but only abnormal results are displayed) Labs Reviewed - No data to display  EKG  EKG Interpretation None       Radiology No results found.  Procedures Procedures (including critical care time)  Medications Ordered in ED Medications - No data to display   Initial Impression / Assessment and Plan / ED Course  I have reviewed the triage vital signs and the nursing notes.  Pertinent labs & imaging results that were available during my care of the patient were reviewed by me and considered in my medical decision making (see chart for  details).  Clinical Course    Will treat with clindamycin. Pt needs to see oral surgeon as she has had area drained previously  Final Clinical Impressions(s) / ED Diagnoses   Final diagnoses:  None    New Prescriptions New Prescriptions   No medications on file   I personally performed the services described in this documentation, which was scribed in my presence. The recorded information has been reviewed and is accurate.    Teressa Lower, NP 06/25/16 1501    Courteney Randall An, MD 06/27/16 1053

## 2017-06-06 IMAGING — CT CT MAXILLOFACIAL W/O CM
2 of 3 series · 11 of 47 positions shown, 13 images · non-contrast
Comparison: 05/23/2015

CLINICAL DATA: Right orbital pain. History of removal of cyst in
the past. Recent treatment for sinusitis.

EXAM:
CT MAXILLOFACIAL WITHOUT CONTRAST
TECHNIQUE: Multidetector CT imaging of the maxillofacial structures was
performed. Multiplanar CT image reconstructions were also generated.
A small metallic BB was placed on the right temple in order to
reliably differentiate right from left.

[Series 208: sagittal s.t. · sagittal · 0.43mm/px · 3 of 71 slices shown]
[im 24/71  bone]
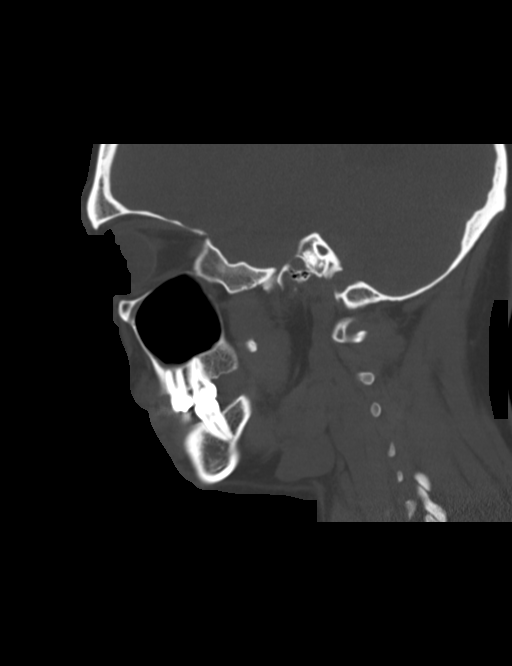
[im 36/71  bone]
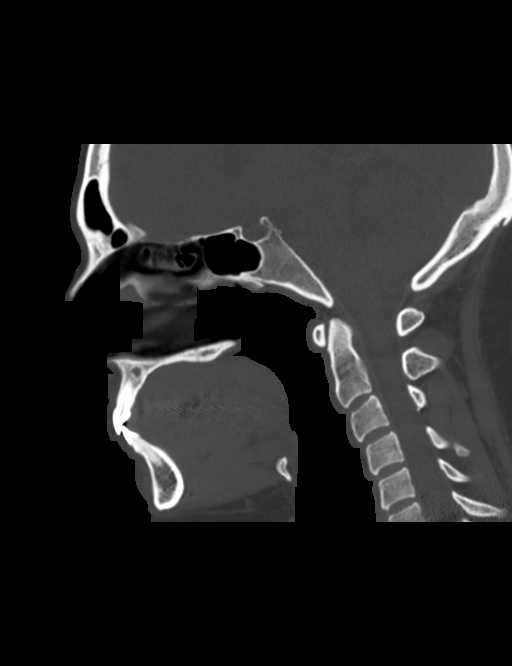
[im 47/71  bone]
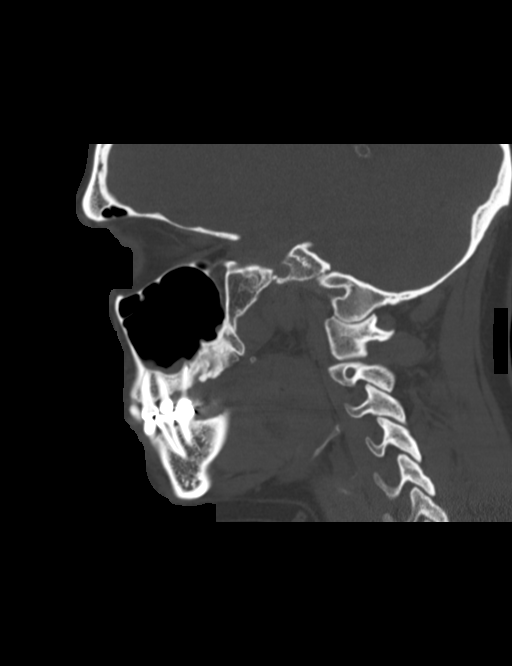

[Series 209: coronal s.t. · coronal · 0.43mm/px · 8 of 83 slices shown, 10 images]
[im 10/83  brain]
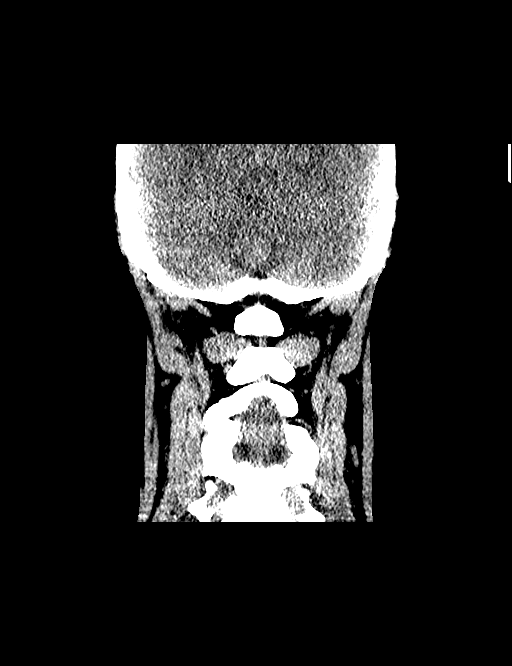
[im 10/83  bone]
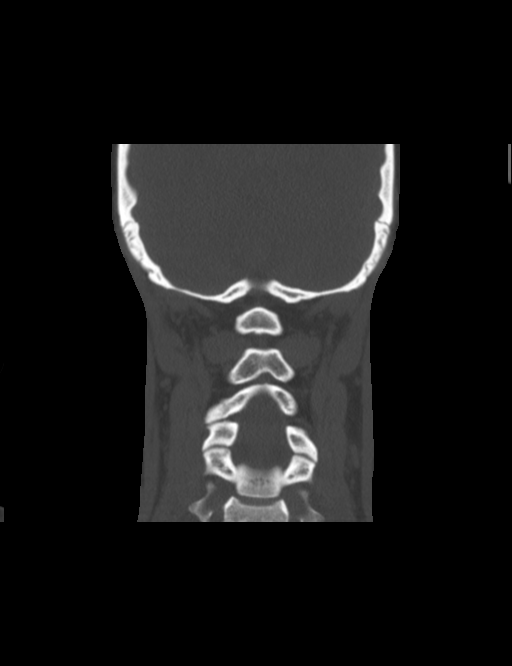
[im 19/83  bone]
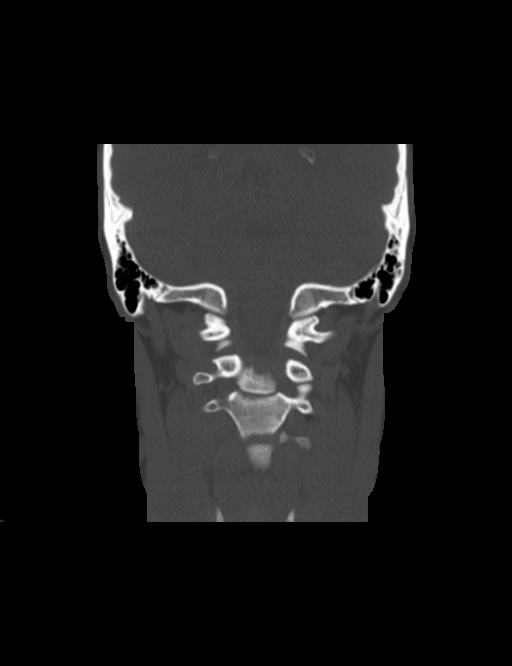
[im 28/83  bone]
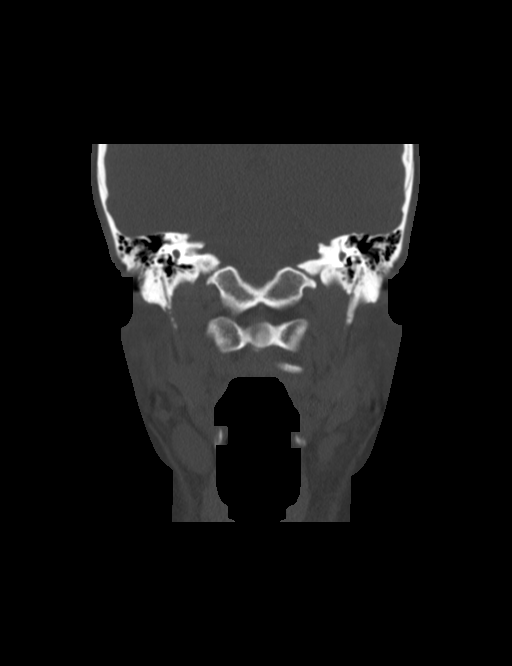
[im 37/83  bone]
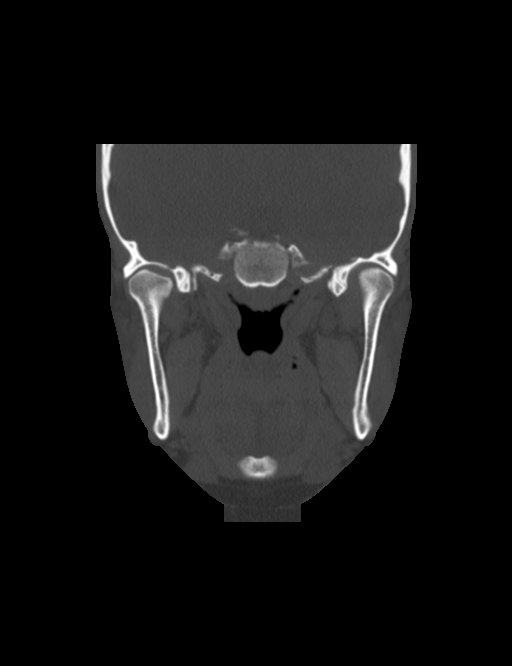
[im 46/83  brain]
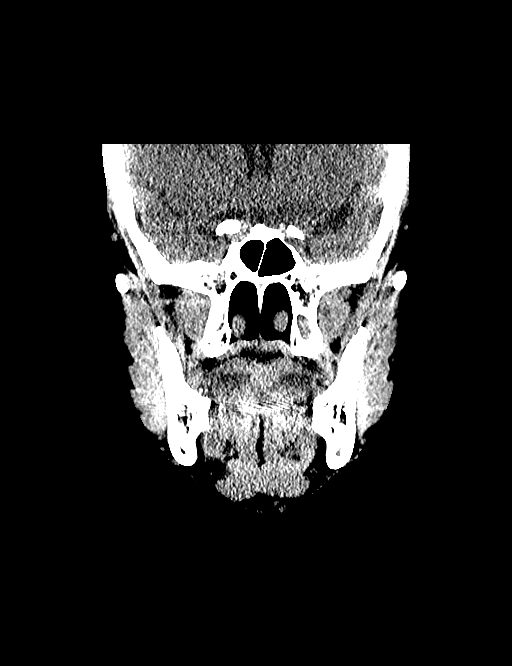
[im 46/83  bone]
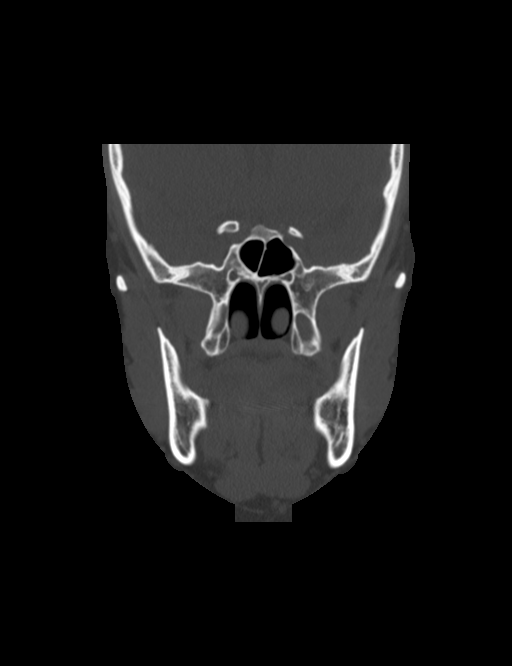
[im 55/83  bone]
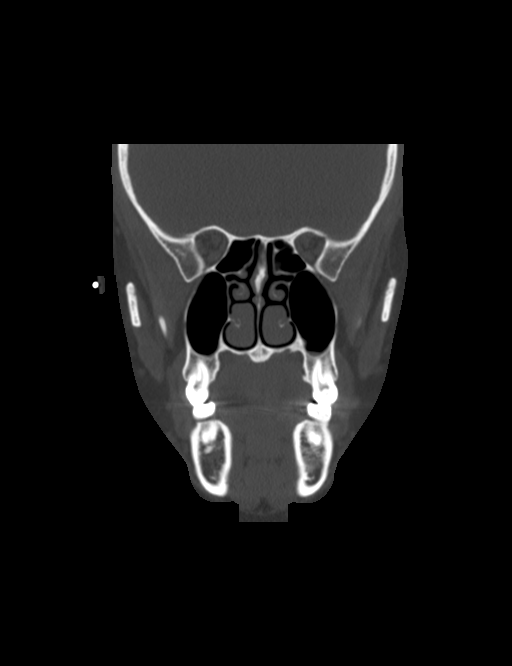
[im 64/83  bone]
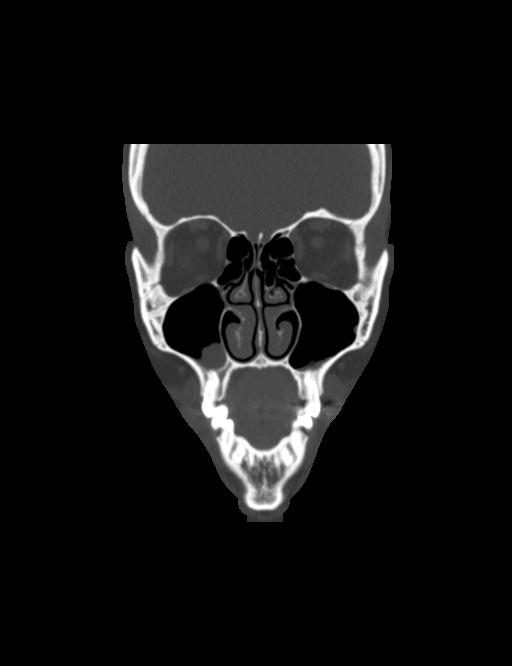
[im 73/83  bone]
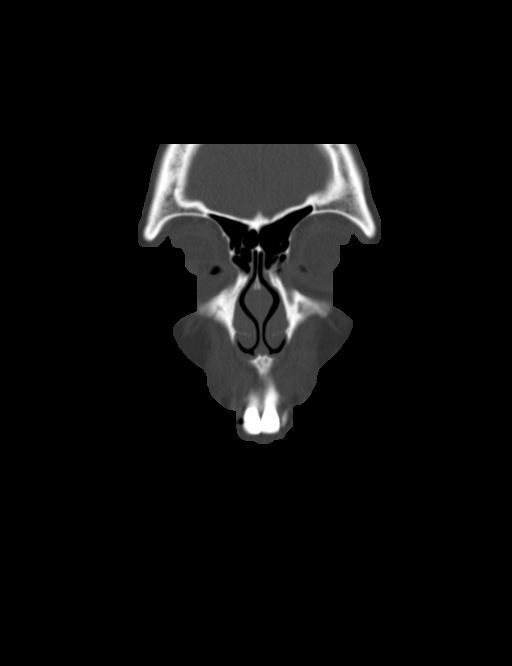

[11 of 47 positions shown; findings below may reference images not displayed]

FINDINGS: Osseous: No fracture or mandibular dislocation. No destructive
process. Lucency noted around the left upper molars as seen on prior
study compatible with periapical abscess. This is unchanged.

Orbits: Negative. No traumatic or inflammatory finding.

Sinuses: Mucosal thickening in the floor of the maxillary sinuses
bilaterally, similar prior study.

Soft tissues: Negative.

Limited intracranial: No significant or unexpected finding.
IMPRESSION: Stable lucency around the left upper first molar compatible with
periapical abscess.

Stable chronic sinusitis changes in the lower the maxillary sinuses.

No orbital soft tissue abnormality.

No acute bony abnormality.

## 2020-10-28 ENCOUNTER — Encounter: Payer: Self-pay | Admitting: Emergency Medicine

## 2020-10-28 ENCOUNTER — Ambulatory Visit
Admission: EM | Admit: 2020-10-28 | Discharge: 2020-10-28 | Disposition: A | Discharge status: Home / Self Care | Payer: 59 | Attending: Emergency Medicine | Admitting: Emergency Medicine

## 2020-10-28 ENCOUNTER — Other Ambulatory Visit: Payer: Self-pay

## 2020-10-28 DIAGNOSIS — J020 Streptococcal pharyngitis: Secondary | ICD-10-CM

## 2020-10-28 DIAGNOSIS — Z20822 Contact with and (suspected) exposure to covid-19: Secondary | ICD-10-CM

## 2020-10-28 LAB — POCT RAPID STREP A (OFFICE): Rapid Strep A Screen: POSITIVE — AB

## 2020-10-28 MED ORDER — PREDNISONE 20 MG PO TABS: 40.0000 mg | ORAL_TABLET | Freq: Every day | ORAL | 0 refills | Status: DC

## 2020-10-28 MED ORDER — CEFTRIAXONE SODIUM 500 MG IJ SOLR
500.0000 mg | Freq: Once | INTRAMUSCULAR | Status: AC
  Administered 2020-10-28: 500 mg via INTRAMUSCULAR

## 2020-10-28 MED ORDER — DEXAMETHASONE SODIUM PHOSPHATE 10 MG/ML IJ SOLN
10.0000 mg | Freq: Once | INTRAMUSCULAR | Status: AC
  Administered 2020-10-28: 10 mg via INTRAMUSCULAR

## 2020-10-28 MED ORDER — AMOXICILLIN 875 MG PO TABS: 875.0000 mg | ORAL_TABLET | Freq: Two times a day (BID) | ORAL | 0 refills | Status: DC

## 2020-10-28 MED ORDER — LIDOCAINE VISCOUS HCL 2 % MT SOLN: 15.0000 mL | OROMUCOSAL | 0 refills | Status: DC | PRN

## 2020-10-28 NOTE — ED Provider Notes (Signed)
[name]    [name]: [id number] Arrival date & time: 10/28/20  9798      History   Chief Complaint Chief Complaint  Patient presents with  . Sore Throat  . Vomiting    HPI Renee Shaw is a 35 y.o. female.   HPI  Patient presents with URI symptoms including  sore throat, nausea,  nasal congestion, and sinus pressure. Unknown COVID exposure. COVIFlu vaccinated: Y/N. Patient is unable to swallow due to oropharyngeal swelling. She denies distress.  Denies worrisome symptoms of shortness of breath, weakness, N&V, or chest pain. Past Medical History:  Diagnosis Date  . Allergy   . Anxiety   . Breech presentation delivered 11/13/2014  . CHICKENPOX, HX OF   . Elevated blood pressure 11/26/2014  . Headache on top of head 11/26/2014  . Heartburn in pregnancy   . Migraine headache with aura   . Postpartum care following cesarean delivery (1/25) 11/13/2014  . Restless leg syndrome     Patient Active Problem List   Diagnosis Date Noted  . Headache on top of head 11/26/2014  . Elevated blood pressure 11/26/2014  . Breech birth 11/14/2014  . Breech presentation delivered 11/13/2014  . Postpartum care following cesarean delivery (1/25) 11/13/2014  . Anxiety   . Migraine headache with aura 03/18/2012  . CHICKENPOX, HX OF 11/23/2009    Past Surgical History:  Procedure Laterality Date  . CESAREAN SECTION N/A 11/13/2014   Procedure: Primary CESAREAN SECTION;  Surgeon: Tresa Endo A. Ernestina Penna, MD;  Location: [facility];  Service: Obstetrics;  Laterality: N/A;  EDD: 11/16/14  . MOUTH SURGERY     cyst removed x 3    OB History    Gravida  1   Para  1   Term  1   Preterm      AB      Living  1     SAB      IAB      Ectopic      Multiple  0   Live Births  1            Home Medications    Prior to Admission medications   Medication Sig Start Date End Date Taking? Authorizing Provider  clindamycin (CLEOCIN) 150 MG capsule Take 1 capsule (150 mg total)  by mouth every 6 (six) hours. Patient not taking: Reported on 10/28/2020 06/25/16   Teressa Lower, NP  HYDROcodone-acetaminophen (NORCO/VICODIN) 5-325 MG per tablet Take 1-2 tablets by mouth every 6 hours as needed for pain and/or cough. Patient not taking: Reported on 10/28/2020 05/23/15   Pisciotta, Joni Reining, PA-C  ibuprofen (ADVIL,MOTRIN) 600 MG tablet Take 1 tablet (600 mg total) by mouth every 6 (six) hours as needed for mild pain. 11/15/14   Raelyn Mora, CNM  iron polysaccharides (NIFEREX) 150 MG capsule Take 1 capsule (150 mg total) by mouth 2 (two) times daily. 11/15/14   Raelyn Mora, CNM  magnesium oxide (MAG-OX) 400 (241.3 MG) MG tablet Take 0.5 tablets (200 mg total) by mouth daily. 11/15/14   Raelyn Mora, CNM  Prenatal Vit-Fe Fumarate-FA (PRENATAL MULTIVITAMIN) TABS tablet Take 1 tablet by mouth daily at 12 noon.    [provider]    Family History Family History  Problem Relation Age of Onset  . Diabetes Father   . Heart attack Father 30       x6  . Migraines Maternal Grandmother   . Cancer Maternal Grandfather        liver  .  Cancer Paternal Grandmother        lung    Social History Social History   Tobacco Use  . Smoking status: Former Smoker    Packs/day: 0.50    Years: 10.00    Pack years: 5.00    Types: Cigarettes    Quit date: 02/17/2014    Years since quitting: 6.6  . Smokeless tobacco: Never Used  . Tobacco comment: works for [employer]  Substance Use Topics  . Alcohol use: No  . Drug use: No     Allergies   Patient has no known allergies.   Review of Systems Review of Systems Pertinent negatives listed in HPI   Physical Exam Triage Vital Signs ED Triage Vitals [10/28/20 1011]  Enc Vitals Group     BP (!) 158/90     Pulse Rate (!) 114     Resp 18     Temp 99.8 F (37.7 C)     Temp Source Oral     SpO2 98 %     Weight      Height      Head Circumference      Peak Flow      Pain Score 8     Pain Loc       Pain Edu?      Excl. in [place]?    No data found.  Updated Vital Signs BP (!) 158/90 (BP Location: Left Arm)   Pulse (!) 114   Temp 99.8 F (37.7 C) (Oral)   Resp 18   SpO2 98%   Visual Acuity Right Eye Distance:   Left Eye Distance:   Bilateral Distance:    Right Eye Near:   Left Eye Near:    Bilateral Near:     Physical Exam HENT:     Nose: Congestion and rhinorrhea present.     Mouth/Throat:     Pharynx: Pharyngeal swelling, oropharyngeal exudate, posterior oropharyngeal erythema and uvula swelling present.     Tonsils: Tonsillar exudate present. 3+ on the right. 3+ on the left.  Cardiovascular:     Rate and Rhythm: Tachycardia present.  Pulmonary:     Effort: Pulmonary effort is normal.     Breath sounds: Normal breath sounds and air entry.  Neurological:     Mental Status: She is alert.  Psychiatric:        Attention and Perception: Attention normal.        Mood and Affect: Mood normal.        Speech: Speech normal.      UC Treatments / Results  Labs (all labs ordered are listed, but only abnormal results are displayed) Labs Reviewed  POCT RAPID STREP A (OFFICE) - Abnormal; Notable for the following components:      Result Value   Rapid Strep A Screen Positive (*)    All other components within normal limits  NOVEL CORONAVIRUS, NAA    EKG   Radiology No results found.  Procedures Procedures (including critical care time)  Medications Ordered in UC Medications - No data to display  Initial Impression / Assessment and Plan / UC Course  I have reviewed the triage vital signs and the nursing notes.  Pertinent labs & imaging results that were available during my care of the patient were reviewed by me and considered in my medical decision making (see chart for details).     Rapid strep positive and COVID test pending. Diffuse oropharyngeal swelling, will cover with Rocephin IM and Decadron  here today. Start oral prednisone and oral amoxicillin  tomorrow. CDC isolation recommendation discussed. Er precaution given. Final Clinical Impressions(s) / UC Diagnoses   Final diagnoses:  Encounter for screening laboratory testing for COVID-19 virus   Discharge Instructions   None    ED Prescriptions    None     PDMP not reviewed this encounter.   Bing Neighbors, FNP 10/28/20 1049

## 2020-10-28 NOTE — ED Triage Notes (Signed)
Pt here for sore throat and vomiting x 2 days with fever

## 2020-10-28 NOTE — Discharge Instructions (Addendum)
Your COVID/Flu results will be available in 3-5 days. Negative results are immediately resulted to Mychart. Positive results will receive a follow-up call from our clinic. If symptoms are present, I recommend home quarantine until results are known. Her current CDC recommendations quarantine for total 5 days wear mask when interacting with others.

## 2020-11-01 LAB — NOVEL CORONAVIRUS, NAA: SARS-CoV-2, NAA: NOT DETECTED

## 2021-01-10 ENCOUNTER — Ambulatory Visit
Admission: EM | Admit: 2021-01-10 | Discharge: 2021-01-10 | Disposition: A | Discharge status: Home / Self Care | Payer: 59 | Attending: Emergency Medicine | Admitting: Emergency Medicine

## 2021-01-10 ENCOUNTER — Other Ambulatory Visit: Payer: Self-pay

## 2021-01-10 DIAGNOSIS — Z20822 Contact with and (suspected) exposure to covid-19: Secondary | ICD-10-CM | POA: Diagnosis present

## 2021-01-10 DIAGNOSIS — B9789 Other viral agents as the cause of diseases classified elsewhere: Secondary | ICD-10-CM | POA: Diagnosis present

## 2021-01-10 DIAGNOSIS — J028 Acute pharyngitis due to other specified organisms: Secondary | ICD-10-CM

## 2021-01-10 LAB — POCT RAPID STREP A (OFFICE): Rapid Strep A Screen: NEGATIVE

## 2021-01-10 MED ORDER — IBUPROFEN 600 MG PO TABS: 600.0000 mg | ORAL_TABLET | Freq: Four times a day (QID) | ORAL | 0 refills | Status: AC | PRN

## 2021-01-10 NOTE — ED Triage Notes (Signed)
Pt reports sore throat x 4 days, primarily R side.  Some difficulty swallowing.  Pain radiates up into head.  Took Ibuprofen which relieved some pain.

## 2021-01-10 NOTE — Discharge Instructions (Signed)
Ibuprofen and Tylenol for sore throat May take daily Zyrtec/Claritin for congestion and drainage Rest and fluids Follow-up if not improving or worsening

## 2021-01-10 NOTE — ED Provider Notes (Signed)
[name]    [name]: [id number] Arrival date & time: 01/10/21  1619      History   Chief Complaint Chief Complaint  Patient presents with  . Sore Throat    HPI Renee Shaw is a 35 y.o. female presenting today for evaluation of sore throat.  Sore throat began a few days ago.  Has had a mild cough and congestion which has been lingering from prior respiratory infection.  Denies any fevers.  Does report some slight nausea.  Hot and cold chills.  HPI  Past Medical History:  Diagnosis Date  . Allergy   . Anxiety   . Breech presentation delivered 11/13/2014  . CHICKENPOX, HX OF   . Elevated blood pressure 11/26/2014  . Headache on top of head 11/26/2014  . Heartburn in pregnancy   . Migraine headache with aura   . Postpartum care following cesarean delivery (1/25) 11/13/2014  . Restless leg syndrome     Patient Active Problem List   Diagnosis Date Noted  . Headache on top of head 11/26/2014  . Elevated blood pressure 11/26/2014  . Breech birth 11/14/2014  . Breech presentation delivered 11/13/2014  . Postpartum care following cesarean delivery (1/25) 11/13/2014  . Anxiety   . Migraine headache with aura 03/18/2012  . CHICKENPOX, HX OF 11/23/2009    Past Surgical History:  Procedure Laterality Date  . CESAREAN SECTION N/A 11/13/2014   Procedure: Primary CESAREAN SECTION;  Surgeon: Tresa Endo A. Ernestina Penna, MD;  Location: [facility];  Service: Obstetrics;  Laterality: N/A;  EDD: 11/16/14  . MOUTH SURGERY     cyst removed x 3    OB History    Gravida  1   Para  1   Term  1   Preterm      AB      Living  1     SAB      IAB      Ectopic      Multiple  0   Live Births  1            Home Medications    Prior to Admission medications   Medication Sig Start Date End Date Taking? Authorizing Provider  ALPRAZolam Prudy Feeler) 0.25 MG tablet Take 0.25 mg by mouth at bedtime as needed for anxiety.   Yes [provider]  omeprazole (PRILOSEC)  40 MG capsule Take 40 mg by mouth daily.   Yes [provider]  ibuprofen (ADVIL) 600 MG tablet Take 1 tablet (600 mg total) by mouth every 6 (six) hours as needed for mild pain. 01/10/21   Tatiyana Foucher C, PA-C  iron polysaccharides (NIFEREX) 150 MG capsule Take 1 capsule (150 mg total) by mouth 2 (two) times daily. 11/15/14 01/10/21  Raelyn Mora, CNM    Family History Family History  Problem Relation Age of Onset  . Diabetes Father   . Heart attack Father 30       x6  . Migraines Maternal Grandmother   . Cancer Maternal Grandfather        liver  . Cancer Paternal Grandmother        lung    Social History Social History   Tobacco Use  . Smoking status: Former Smoker    Packs/day: 0.50    Years: 10.00    Pack years: 5.00    Types: Cigarettes    Quit date: 02/17/2014    Years since quitting: 6.9  . Smokeless tobacco: Never Used  . Tobacco  comment: works for [employer]  . Vaping Use: Every day  . Substances: Nicotine, Flavoring  Substance Use Topics  . Alcohol use: No  . Drug use: No     Allergies   Patient has no known allergies.   Review of Systems Review of Systems  Constitutional: Negative for activity change, appetite change, chills, fatigue and fever.  HENT: Positive for sore throat. Negative for congestion, ear pain, rhinorrhea, sinus pressure and trouble swallowing.   Eyes: Negative for discharge and redness.  Respiratory: Positive for cough. Negative for chest tightness and shortness of breath.   Cardiovascular: Negative for chest pain.  Gastrointestinal: Negative for abdominal pain, diarrhea, nausea and vomiting.  Musculoskeletal: Negative for myalgias.  Skin: Negative for rash.  Neurological: Negative for dizziness, light-headedness and headaches.     Physical Exam Triage Vital Signs ED Triage Vitals  Enc Vitals Group     BP      Pulse      Resp      Temp      Temp src      SpO2      Weight      Height       Head Circumference      Peak Flow      Pain Score      Pain Loc      Pain Edu?      Excl. in [place]?    No data found.  Updated Vital Signs BP (!) 135/97 (BP Location: Left Arm)   Pulse 96   Temp 98.3 F (36.8 C) (Oral)   Resp 18   LMP 12/28/2020   SpO2 98%   Breastfeeding No   Visual Acuity Right Eye Distance:   Left Eye Distance:   Bilateral Distance:    Right Eye Near:   Left Eye Near:    Bilateral Near:     Physical Exam Vitals and nursing note reviewed.  Constitutional:      Appearance: She is well-developed.     Comments: No acute distress  HENT:     Head: Normocephalic and atraumatic.     Ears:     Comments: Bilateral ears without tenderness to palpation of external auricle, tragus and mastoid, EAC's without erythema or swelling, TM's with good bony landmarks and cone of light. Non erythematous.     Nose: Nose normal.     Mouth/Throat:     Comments: Oral mucosa pink and moist, no tonsillar enlargement or exudate. Posterior pharynx patent and nonerythematous, no uvula deviation or swelling. Normal phonation. Eyes:     Conjunctiva/sclera: Conjunctivae normal.  Cardiovascular:     Rate and Rhythm: Normal rate.  Pulmonary:     Effort: Pulmonary effort is normal. No respiratory distress.     Comments: Breathing comfortably at rest, CTABL, no wheezing, rales or other adventitious sounds auscultated Abdominal:     General: There is no distension.  Musculoskeletal:        General: Normal range of motion.     Cervical back: Neck supple.  Skin:    General: Skin is warm and dry.  Neurological:     Mental Status: She is alert and oriented to person, place, and time.      UC Treatments / Results  Labs (all labs ordered are listed, but only abnormal results are displayed) Labs Reviewed  SARS CORONAVIRUS 2 (TAT 6-24 HRS)  CULTURE, GROUP A STREP ([facility])  NOVEL CORONAVIRUS, NAA  POCT RAPID STREP A (OFFICE)  EKG   Radiology No results  found.  Procedures Procedures (including critical care time)  Medications Ordered in UC Medications - No data to display  Initial Impression / Assessment and Plan / UC Course  I have reviewed the triage vital signs and the nursing notes.  Pertinent labs & imaging results that were available during my care of the patient were reviewed by me and considered in my medical decision making (see chart for details).    Strep test negative, Covid test pending.  Suspect likely viral etiology and recommending continued symptomatic and supportive care.  Exam reassuring, vital signs stable.  Provided recommendations.  Continue to monitor,Discussed strict return precautions. Patient verbalized understanding and is agreeable with plan.  Final Clinical Impressions(s) / UC Diagnoses   Final diagnoses:  Encounter for laboratory testing for COVID-19 virus  Sore throat (viral)     Discharge Instructions     Ibuprofen and Tylenol for sore throat May take daily Zyrtec/Claritin for congestion and drainage Rest and fluids Follow-up if not improving or worsening    ED Prescriptions    Medication Sig Dispense Auth. Provider   ibuprofen (ADVIL) 600 MG tablet Take 1 tablet (600 mg total) by mouth every 6 (six) hours as needed for mild pain. 30 tablet Juelz Whittenberg, [place] C, PA-C     PDMP not reviewed this encounter.   Lew Dawes, PA-C 01/10/21 1706

## 2021-01-11 LAB — NOVEL CORONAVIRUS, NAA: SARS-CoV-2, NAA: NOT DETECTED

## 2021-01-11 LAB — SARS-COV-2, NAA 2 DAY TAT

## 2021-01-13 LAB — CULTURE, GROUP A STREP (THRC)

## 2021-01-14 ENCOUNTER — Ambulatory Visit: Payer: Self-pay

## 2021-08-30 ENCOUNTER — Encounter: Payer: Self-pay | Admitting: Emergency Medicine

## 2021-08-30 ENCOUNTER — Ambulatory Visit
Admission: EM | Admit: 2021-08-30 | Discharge: 2021-08-30 | Disposition: A | Discharge status: Home / Self Care | Payer: 59 | Attending: Internal Medicine | Admitting: Internal Medicine

## 2021-08-30 ENCOUNTER — Other Ambulatory Visit: Payer: Self-pay

## 2021-08-30 DIAGNOSIS — J029 Acute pharyngitis, unspecified: Secondary | ICD-10-CM | POA: Diagnosis present

## 2021-08-30 LAB — POCT RAPID STREP A (OFFICE): Rapid Strep A Screen: NEGATIVE

## 2021-08-30 NOTE — ED Triage Notes (Signed)
Sore throat starting last night, worried it's strep, states she gets it frequently. Tonsils appear swollen with white exudate. Took 3 ibuprofen around 5 a.m. this morning.

## 2021-08-30 NOTE — Discharge Instructions (Addendum)
Warm salt water gargle Tylenol/Motrin as needed for pain. Chloraseptic throat spray Maintain adequate hydration If symptoms worsen please return to urgent care to be reevaluated We will call you with recommendations if labs are abnormal.

## 2021-08-30 NOTE — ED Provider Notes (Signed)
[name]    [name] Arrival date & time: 08/30/21  1140      History   Chief Complaint Chief Complaint  Patient presents with   Sore Throat    HPI Renee Shaw is a 35 y.o. female to the urgent care with 2-day history of sore throat, difficulty swallowing and exudates on her tonsils.  Patient says symptoms started yesterday and has been persistent.  She denies any fever or chills.  No nausea vomiting or diarrhea.  No sick contacts.  Patient is not vaccinated against COVID-19 virus.  No shortness of breath or wheezing.  Patient complains of generalized body aches.   HPI  Past Medical History:  Diagnosis Date   Allergy    Anxiety    Breech presentation delivered 11/13/2014   CHICKENPOX, HX OF    Elevated blood pressure 11/26/2014   Headache on top of head 11/26/2014   Heartburn in pregnancy    Migraine headache with aura    Postpartum care following cesarean delivery (1/25) 11/13/2014   Restless leg syndrome     Patient Active Problem List   Diagnosis Date Noted   Headache on top of head 11/26/2014   Elevated blood pressure 11/26/2014   Breech birth 11/14/2014   Breech presentation delivered 11/13/2014   Postpartum care following cesarean delivery (1/25) 11/13/2014   Anxiety    Migraine headache with aura 03/18/2012   CHICKENPOX, HX OF 11/23/2009    Past Surgical History:  Procedure Laterality Date   CESAREAN SECTION N/A 11/13/2014   Procedure: Primary CESAREAN SECTION;  Surgeon: Claiborne Billings A. Pamala Hurry, MD;  Location: [facility];  Service: Obstetrics;  Laterality: N/A;  EDD: 11/16/14   MOUTH SURGERY     cyst removed x 3    OB History     Gravida  1   Para  1   Term  1   Preterm      AB      Living  1      SAB      IAB      Ectopic      Multiple  0   Live Births  1            Home Medications    Prior to Admission medications   Medication Sig Start Date End Date Taking? Authorizing Provider  ALPRAZolam Duanne Moron) 0.25  MG tablet Take 0.25 mg by mouth at bedtime as needed for anxiety.    [provider]  ibuprofen (ADVIL) 600 MG tablet Take 1 tablet (600 mg total) by mouth every 6 (six) hours as needed for mild pain. 01/10/21   Wieters, Hallie C, PA-C  omeprazole (PRILOSEC) 40 MG capsule Take 40 mg by mouth daily.    [provider]  iron polysaccharides (NIFEREX) 150 MG capsule Take 1 capsule (150 mg total) by mouth 2 (two) times daily. 11/15/14 01/10/21  Laury Deep, CNM    Family History Family History  Problem Relation Age of Onset   Diabetes Father    Heart attack Father 48       x6   Migraines Maternal Grandmother    Cancer Maternal Grandfather        liver   Cancer Paternal Grandmother        lung    Social History Social History   Tobacco Use   Smoking status: Former    Packs/day: 0.50    Years: 10.00    Pack years: 5.00    Types: Cigarettes  Quit date: 02/17/2014    Years since quitting: 7.5   Smokeless tobacco: Never   Tobacco comments:    works for [employer] Use: Every day   Substances: Nicotine, Flavoring  Substance Use Topics   Alcohol use: No   Drug use: No     Allergies   Patient has no known allergies.   Review of Systems Review of Systems  Constitutional: Negative.   HENT:  Positive for sore throat. Negative for congestion.   Respiratory: Negative.    Musculoskeletal:  Positive for arthralgias and myalgias.  Neurological: Negative.     Physical Exam Triage Vital Signs ED Triage Vitals [08/30/21 1228]  Enc Vitals Group     BP 140/89     Pulse Rate (!) 102     Resp 16     Temp 98.9 F (37.2 C)     Temp Source Oral     SpO2 98 %     Weight      Height      Head Circumference      Peak Flow      Pain Score 4     Pain Loc      Pain Edu?      Excl. in [place]?    No data found.  Updated Vital Signs BP 140/89 (BP Location: Left Arm)   Pulse (!) 102   Temp 98.9 F (37.2 C) (Oral)   Resp 16    SpO2 98%   Visual Acuity Right Eye Distance:   Left Eye Distance:   Bilateral Distance:    Right Eye Near:   Left Eye Near:    Bilateral Near:     Physical Exam Vitals and nursing note reviewed.  HENT:     Right Ear: Tympanic membrane normal.     Left Ear: Tympanic membrane normal.     Mouth/Throat:     Mouth: Mucous membranes are moist.     Pharynx: Posterior oropharyngeal erythema present.     Tonsils: No tonsillar exudate or tonsillar abscesses.  Eyes:     Conjunctiva/sclera: Conjunctivae normal.     Pupils: Pupils are equal, round, and reactive to light.  Pulmonary:     Effort: Pulmonary effort is normal.     Breath sounds: Normal breath sounds.  Neurological:     Mental Status: She is alert.     UC Treatments / Results  Labs (all labs ordered are listed, but only abnormal results are displayed) Labs Reviewed  COVID-19, FLU A+B NAA  POCT RAPID STREP A (OFFICE)    EKG   Radiology No results found.  Procedures Procedures (including critical care time)  Medications Ordered in UC Medications - No data to display  Initial Impression / Assessment and Plan / UC Course  I have reviewed the triage vital signs and the nursing notes.  Pertinent labs & imaging results that were available during my care of the patient were reviewed by me and considered in my medical decision making (see chart for details).     1.  Acute viral pharyngitis: Point-of-care strep is negative Strep cultures will be sent Maintain adequate hydration COVID-19/flu A/B PCR test has been sent We will call patient with recommendations if labs are abnormal. Return to urgent care if symptoms worsen. Final Clinical Impressions(s) / UC Diagnoses   Final diagnoses:  Acute viral pharyngitis     Discharge Instructions      Warm salt water gargle Tylenol/Motrin as needed for  pain. Chloraseptic throat spray Maintain adequate hydration If symptoms worsen please return to urgent care  to be reevaluated We will call you with recommendations if labs are abnormal.   ED Prescriptions   None    PDMP not reviewed this encounter.   Merrilee Jansky, MD 08/30/21 1540

## 2021-08-31 LAB — COVID-19, FLU A+B NAA
Influenza A, NAA: NOT DETECTED
Influenza B, NAA: NOT DETECTED
SARS-CoV-2, NAA: NOT DETECTED

## 2021-09-02 LAB — CULTURE, GROUP A STREP (THRC)

## 2022-05-29 ENCOUNTER — Ambulatory Visit
Admission: EM | Admit: 2022-05-29 | Discharge: 2022-05-29 | Disposition: A | Discharge status: Home / Self Care | Payer: 59 | Attending: Physician Assistant | Admitting: Physician Assistant

## 2022-05-29 ENCOUNTER — Ambulatory Visit: Payer: Self-pay

## 2022-05-29 DIAGNOSIS — M545 Low back pain, unspecified: Secondary | ICD-10-CM

## 2022-05-29 LAB — POCT URINALYSIS DIP (MANUAL ENTRY)
Bilirubin, UA: NEGATIVE
Blood, UA: NEGATIVE
Glucose, UA: NEGATIVE mg/dL
Ketones, POC UA: NEGATIVE mg/dL
Leukocytes, UA: NEGATIVE
Nitrite, UA: NEGATIVE
Protein Ur, POC: NEGATIVE mg/dL
Spec Grav, UA: 1.015 (ref 1.010–1.025)
Urobilinogen, UA: 0.2 E.U./dL
pH, UA: 7.5 (ref 5.0–8.0)

## 2022-05-29 LAB — POCT URINE PREGNANCY: Preg Test, Ur: NEGATIVE

## 2022-05-29 MED ORDER — CYCLOBENZAPRINE HCL 10 MG PO TABS: 10.0000 mg | ORAL_TABLET | Freq: Two times a day (BID) | ORAL | 0 refills | Status: DC | PRN

## 2022-05-29 MED ORDER — PREDNISONE 20 MG PO TABS: 40.0000 mg | ORAL_TABLET | Freq: Every day | ORAL | 0 refills | Status: AC

## 2022-05-29 NOTE — ED Provider Notes (Signed)
[name]    [name]: [id number] Arrival date & time: 05/29/22  1253      History   Chief Complaint Chief Complaint  Patient presents with   Flank Pain   Abdominal Pain    HPI Renee Shaw is a 36 y.o. female.   Patient here today for evaluation of lower abdominal pain and bilateral flank pain has been ongoing for 5 days.  She reports that most of her pain is in her back and seems to feel like a throbbing pain at times at night.  She notes that taking deep breath seems to worsen pain at times.  She has not had fever.  She does not report treatment for symptoms.  The history is provided by the patient.  Flank Pain  Abdominal Pain Associated symptoms: no chills, no fever, no nausea and no vomiting     Past Medical History:  Diagnosis Date   Allergy    Anxiety    Breech presentation delivered 11/13/2014   CHICKENPOX, HX OF    Elevated blood pressure 11/26/2014   Headache on top of head 11/26/2014   Heartburn in pregnancy    Migraine headache with aura    Postpartum care following cesarean delivery (1/25) 11/13/2014   Restless leg syndrome     Patient Active Problem List   Diagnosis Date Noted   Headache on top of head 11/26/2014   Elevated blood pressure 11/26/2014   Breech birth 11/14/2014   Breech presentation delivered 11/13/2014   Postpartum care following cesarean delivery (1/25) 11/13/2014   Anxiety    Migraine headache with aura 03/18/2012   CHICKENPOX, HX OF 11/23/2009    Past Surgical History:  Procedure Laterality Date   CESAREAN SECTION N/A 11/13/2014   Procedure: Primary CESAREAN SECTION;  Surgeon: Tresa Endo A. Ernestina Penna, MD;  Location: [facility];  Service: Obstetrics;  Laterality: N/A;  EDD: 11/16/14   MOUTH SURGERY     cyst removed x 3    OB History     Gravida  1   Para  1   Term  1   Preterm      AB      Living  1      SAB      IAB      Ectopic      Multiple  0   Live Births  1            Home Medications     Prior to Admission medications   Medication Sig Start Date End Date Taking? Authorizing Provider  cyclobenzaprine (FLEXERIL) 10 MG tablet Take 1 tablet (10 mg total) by mouth 2 (two) times daily as needed for muscle spasms. 05/29/22  Yes Tomi Bamberger, PA-C  predniSONE (DELTASONE) 20 MG tablet Take 2 tablets (40 mg total) by mouth daily with breakfast for 5 days. 05/29/22 06/03/22 Yes Tomi Bamberger, PA-C  ALPRAZolam Prudy Feeler) 0.25 MG tablet Take 0.25 mg by mouth at bedtime as needed for anxiety.    [provider]  ibuprofen (ADVIL) 600 MG tablet Take 1 tablet (600 mg total) by mouth every 6 (six) hours as needed for mild pain. 01/10/21   Wieters, Hallie C, PA-C  omeprazole (PRILOSEC) 40 MG capsule Take 40 mg by mouth daily.    [provider]  iron polysaccharides (NIFEREX) 150 MG capsule Take 1 capsule (150 mg total) by mouth 2 (two) times daily. 11/15/14 01/10/21  Raelyn Mora, CNM    Family History Family History  Problem Relation Age of Onset   Diabetes Father    Heart attack Father 52       x6   Migraines Maternal Grandmother    Cancer Maternal Grandfather        liver   Cancer Paternal Grandmother        lung    Social History Social History   Tobacco Use   Smoking status: Former    Packs/day: 0.50    Years: 10.00    Total pack years: 5.00    Types: Cigarettes    Quit date: 02/17/2014    Years since quitting: 8.2   Smokeless tobacco: Never   Tobacco comments:    works for [employer] Use: Every day   Substances: Nicotine, Flavoring  Substance Use Topics   Alcohol use: No   Drug use: No     Allergies   Patient has no known allergies.   Review of Systems Review of Systems  Constitutional:  Negative for chills and fever.  Eyes:  Negative for discharge and redness.  Gastrointestinal:  Negative for nausea and vomiting.  Genitourinary:  Positive for flank pain.  Musculoskeletal:  Positive for back pain  and myalgias.     Physical Exam Triage Vital Signs ED Triage Vitals  Enc Vitals Group     BP 05/29/22 1332 (!) 137/95     Pulse Rate 05/29/22 1332 88     Resp 05/29/22 1332 18     Temp 05/29/22 1332 98.3 F (36.8 C)     Temp Source 05/29/22 1332 Oral     SpO2 05/29/22 1332 98 %     Weight --      Height --      Head Circumference --      Peak Flow --      Pain Score 05/29/22 1334 8     Pain Loc --      Pain Edu? --      Excl. in GC? --    No data found.  Updated Vital Signs BP (!) 137/95 (BP Location: Left Arm)   Pulse 88   Temp 98.3 F (36.8 C) (Oral)   Resp 18   SpO2 98%      Physical Exam Vitals and nursing note reviewed.  Constitutional:      General: She is not in acute distress.    Appearance: Normal appearance. She is not ill-appearing.  HENT:     Head: Normocephalic and atraumatic.  Eyes:     Conjunctiva/sclera: Conjunctivae normal.  Cardiovascular:     Rate and Rhythm: Normal rate.  Pulmonary:     Effort: Pulmonary effort is normal.  Musculoskeletal:     Comments: No midline spine tenderness, tenderness to palpation noted to bilateral lower back  Neurological:     Mental Status: She is alert.  Psychiatric:        Mood and Affect: Mood normal.        Behavior: Behavior normal.        Thought Content: Thought content normal.      UC Treatments / Results  Labs (all labs ordered are listed, but only abnormal results are displayed) Labs Reviewed  POCT URINALYSIS DIP (MANUAL ENTRY)  POCT URINE PREGNANCY    EKG   Radiology No results found.  Procedures Procedures (including critical care time)  Medications Ordered in UC Medications - No data to display  Initial Impression / Assessment and Plan / UC Course  I have  reviewed the triage vital signs and the nursing notes.  Pertinent labs & imaging results that were available during my care of the patient were reviewed by me and considered in my medical decision making (see chart for  details).    Symptoms suspicious for muscle strain/ spasms and recommended steroids and muscle relaxer.  Patient recently had labs from PCP earlier in the week with no concerning findings.  UA and urine pregnancy normal in office today.  Recommended follow-up if symptoms do not improve or emergency room evaluation with any worsening.  Final Clinical Impressions(s) / UC Diagnoses   Final diagnoses:  Acute bilateral low back pain without sciatica   Discharge Instructions   None    ED Prescriptions     Medication Sig Dispense Auth. Provider   predniSONE (DELTASONE) 20 MG tablet Take 2 tablets (40 mg total) by mouth daily with breakfast for 5 days. 10 tablet Erma Pinto F, PA-C   cyclobenzaprine (FLEXERIL) 10 MG tablet Take 1 tablet (10 mg total) by mouth 2 (two) times daily as needed for muscle spasms. 20 tablet Tomi Bamberger, PA-C      PDMP not reviewed this encounter.   Tomi Bamberger, PA-C 05/29/22 1451

## 2022-05-29 NOTE — ED Triage Notes (Signed)
Pt presents with lower abdominal pain and bilateral flank pain X 5 days.

## 2023-01-05 ENCOUNTER — Ambulatory Visit: Payer: 59 | Admitting: Internal Medicine

## 2023-01-05 ENCOUNTER — Encounter: Payer: Self-pay | Admitting: Internal Medicine

## 2023-01-05 VITALS — BP 144/80 | HR 102 | Temp 98.2°F | Resp 18 | Ht 68.0 in | Wt 272.1 lb

## 2023-01-05 DIAGNOSIS — F321 Major depressive disorder, single episode, moderate: Secondary | ICD-10-CM | POA: Diagnosis not present

## 2023-01-05 DIAGNOSIS — Z131 Encounter for screening for diabetes mellitus: Secondary | ICD-10-CM

## 2023-01-05 DIAGNOSIS — F419 Anxiety disorder, unspecified: Secondary | ICD-10-CM | POA: Diagnosis not present

## 2023-01-05 DIAGNOSIS — K219 Gastro-esophageal reflux disease without esophagitis: Secondary | ICD-10-CM | POA: Diagnosis not present

## 2023-01-05 DIAGNOSIS — I10 Essential (primary) hypertension: Secondary | ICD-10-CM | POA: Diagnosis not present

## 2023-01-05 DIAGNOSIS — F32A Depression, unspecified: Secondary | ICD-10-CM | POA: Insufficient documentation

## 2023-01-05 MED ORDER — OMEPRAZOLE 40 MG PO CPDR: 40.0000 mg | DELAYED_RELEASE_CAPSULE | Freq: Every day | ORAL | 6 refills | Status: DC

## 2023-01-05 MED ORDER — BUPROPION HCL ER (SR) 150 MG PO TB12: 150.0000 mg | ORAL_TABLET | Freq: Two times a day (BID) | ORAL | 2 refills | Status: DC

## 2023-01-05 MED ORDER — ALPRAZOLAM 0.25 MG PO TABS: 0.2500 mg | ORAL_TABLET | Freq: Every evening | ORAL | 3 refills | Status: DC | PRN

## 2023-01-05 NOTE — Assessment & Plan Note (Signed)
She will cut down portion of her meal.

## 2023-01-05 NOTE — Progress Notes (Signed)
Office Visit  Subjective   Patient ID: Renee Shaw   DOB: 07/06/1986   Age: 37 y.o.   MRN: [id number]   Chief Complaint Chief Complaint  Patient presents with   Follow-up    Medication refill     History of Present Illness The patient is a 37 year old Caucasian/White female who presents for follow up. She says that she is under a lot of stress because of child custody issue and problem with her ex who has been drinking problem.  There is send has a joint custody who stay with him during daytime.  This created a lot of stress for her.  She says that she does not want to do anything, does not wanted to talk to anybody and does not wanted to look after herself.she says that she has no motivation to do any exercise and she has gained 18 pounds since last visit.  Her BMI is 41 today.  She could not take phentermine because of side effects. Blood pressure is also elevated.  She take 1 hydrochlorothiazide daily.  She admits that she is depressed but does not have any suicidal or homicidal ideation.  He is still able to maintain a job. Occasionally she uses Xanax when she is overwhelmed. I have reviewed her lipid panel results with her. Her LDL and triglyceride is much better. She is fasting today for repeat blood drawn. She reports no additional symptoms This patient feels that she is able to care for herself. She denies a recent life crisis. She currently lives with her son. She has no significant prior history of mental health disorders.  The patient denies any chest pain, orthopnea, and PND.  She reports there have been no other symptoms noted.    She also has gastroesophageal reflux disease and says that she has to use omeprazole every day.  If she Mr. does she get heartburns.  I have discussed the complication of long-term use of omeprazole.  She will cut down the portion of her meal and will try Pepcid 1st to see if that controls her symptoms.    Past Medical History Past Medical  History:  Diagnosis Date   Allergy    Anxiety    Breech presentation delivered 11/13/2014   CHICKENPOX, HX OF    Elevated blood pressure 11/26/2014   Headache on top of head 11/26/2014   Heartburn in pregnancy    Migraine headache with aura    Postpartum care following cesarean delivery (1/25) 11/13/2014   Restless leg syndrome      Allergies No Known Allergies   Review of Systems Review of Systems  Constitutional: Negative.   HENT: Negative.    Respiratory: Negative.    Cardiovascular: Negative.   Gastrointestinal: Negative.   Neurological: Negative.   Psychiatric/Behavioral:  Positive for depression. Negative for substance abuse and suicidal ideas.        Objective:    Vitals BP (!) 144/80 (BP Location: Left Arm, Patient Position: Sitting, Cuff Size: Normal)   Pulse (!) 102   Temp 98.2 F (36.8 C)   Resp 18   Ht 5\' 8"  (1.727 m)   Wt 272 lb 2 oz (123.4 kg)   SpO2 98%   BMI 41.38 kg/m    Physical Examination Physical Exam Constitutional:      Appearance: Normal appearance. She is obese.  HENT:     Head: Normocephalic and atraumatic.  Eyes:     Extraocular Movements: Extraocular movements intact.  Pupils: Pupils are equal, round, and reactive to light.  Cardiovascular:     Rate and Rhythm: Normal rate and regular rhythm.     Heart sounds: Normal heart sounds.  Pulmonary:     Effort: Pulmonary effort is normal.     Breath sounds: Normal breath sounds.  Abdominal:     General: Bowel sounds are normal.     Palpations: Abdomen is soft.  Neurological:     General: No focal deficit present.     Mental Status: She is alert and oriented to person, place, and time.        Assessment & Plan:   Depression She is on duloxetine 60 mg daily and still actively depress.  I have discussed with her to start bupropion 150 mg daily in the morning and decrease the dose of duloxetine to 40 mg daily and if she did okay then she will cut back to 20 mg daily after 1 week.   She has done counseling before and she will look into that again.  Anxiety She take xanax PRN.  GERD (gastroesophageal reflux disease) She take omeprazole daily  Morbid obesity ([place]) She will cut down portion of her meal.  Hypertension Pressure is high without the diagnosis of hypertension.  She is under a lot of stress to.  I have advised her to do exercise and we will monitor her blood pressure on next visit.    Return in about 1 month (around 02/05/2023).   Garwin Brothers, MD

## 2023-01-05 NOTE — Assessment & Plan Note (Signed)
She take omeprazole daily

## 2023-01-05 NOTE — Assessment & Plan Note (Signed)
She take xanax PRN.

## 2023-01-05 NOTE — Assessment & Plan Note (Signed)
She is on duloxetine 60 mg daily and still actively depress.

## 2023-01-06 ENCOUNTER — Encounter: Payer: Self-pay | Admitting: Internal Medicine

## 2023-01-06 DIAGNOSIS — I10 Essential (primary) hypertension: Secondary | ICD-10-CM | POA: Insufficient documentation

## 2023-01-06 LAB — CMP14 + ANION GAP
ALT: 35 IU/L — ABNORMAL HIGH (ref 0–32)
AST: 24 IU/L (ref 0–40)
Albumin/Globulin Ratio: 1.7 (ref 1.2–2.2)
Albumin: 4.5 g/dL (ref 3.9–4.9)
Alkaline Phosphatase: 74 IU/L (ref 44–121)
Anion Gap: 17 mmol/L (ref 10.0–18.0)
BUN/Creatinine Ratio: 17 (ref 9–23)
BUN: 11 mg/dL (ref 6–20)
Bilirubin Total: 0.3 mg/dL (ref 0.0–1.2)
CO2: 21 mmol/L (ref 20–29)
Calcium: 9.7 mg/dL (ref 8.7–10.2)
Chloride: 102 mmol/L (ref 96–106)
Creatinine, Ser: 0.64 mg/dL (ref 0.57–1.00)
Globulin, Total: 2.7 g/dL (ref 1.5–4.5)
Glucose: 89 mg/dL (ref 70–99)
Potassium: 4.7 mmol/L (ref 3.5–5.2)
Sodium: 140 mmol/L (ref 134–144)
Total Protein: 7.2 g/dL (ref 6.0–8.5)
eGFR: 117 mL/min/{1.73_m2} (ref >59)

## 2023-01-06 LAB — LIPID PANEL
Chol/HDL Ratio: 4.6 ratio — ABNORMAL HIGH (ref 0.0–4.4)
Cholesterol, Total: 264 mg/dL — ABNORMAL HIGH (ref 100–199)
HDL: 57 mg/dL (ref >39)
LDL Chol Calc (NIH): 166 mg/dL — ABNORMAL HIGH (ref 0–99)
Triglycerides: 223 mg/dL — ABNORMAL HIGH (ref 0–149)
VLDL Cholesterol Cal: 41 mg/dL — ABNORMAL HIGH (ref 5–40)

## 2023-01-06 LAB — HEMOGLOBIN A1C
Est. average glucose Bld gHb Est-mCnc: 100 mg/dL
Hgb A1c MFr Bld: 5.1 % (ref 4.8–5.6)

## 2023-01-06 LAB — TSH: TSH: 2.39 u[IU]/mL (ref 0.450–4.500)

## 2023-01-06 NOTE — Progress Notes (Signed)
Notified pt though mychart.

## 2023-01-06 NOTE — Assessment & Plan Note (Signed)
Pressure is high without the diagnosis of hypertension.  She is under a lot of stress to.  I have advised her to do exercise and we will monitor her blood pressure on next visit.

## 2023-02-02 ENCOUNTER — Ambulatory Visit: Payer: 59 | Admitting: Internal Medicine

## 2023-02-09 ENCOUNTER — Ambulatory Visit: Payer: 59 | Admitting: Internal Medicine

## 2023-07-29 ENCOUNTER — Other Ambulatory Visit: Payer: Self-pay | Admitting: Internal Medicine

## 2023-07-31 ENCOUNTER — Other Ambulatory Visit: Payer: Self-pay

## 2023-07-31 ENCOUNTER — Other Ambulatory Visit: Payer: Self-pay | Admitting: Internal Medicine

## 2023-07-31 MED ORDER — FLUOXETINE HCL 20 MG PO CAPS: 60.0000 mg | ORAL_CAPSULE | Freq: Every morning | ORAL | 1 refills | Status: DC

## 2023-07-31 MED ORDER — ALPRAZOLAM 0.25 MG PO TABS: 0.2500 mg | ORAL_TABLET | Freq: Every evening | ORAL | 3 refills | Status: DC | PRN

## 2023-08-03 ENCOUNTER — Other Ambulatory Visit: Payer: Self-pay | Admitting: Internal Medicine

## 2023-08-24 ENCOUNTER — Encounter: Payer: Self-pay | Admitting: Internal Medicine

## 2023-08-24 ENCOUNTER — Ambulatory Visit: Payer: 59 | Admitting: Internal Medicine

## 2023-08-24 VITALS — BP 144/80 | HR 88 | Temp 98.3°F | Resp 18 | Ht 68.0 in | Wt 265.0 lb

## 2023-08-24 DIAGNOSIS — I10 Essential (primary) hypertension: Secondary | ICD-10-CM

## 2023-08-24 DIAGNOSIS — F419 Anxiety disorder, unspecified: Secondary | ICD-10-CM | POA: Diagnosis not present

## 2023-08-24 DIAGNOSIS — F422 Mixed obsessional thoughts and acts: Secondary | ICD-10-CM | POA: Diagnosis not present

## 2023-08-24 DIAGNOSIS — F429 Obsessive-compulsive disorder, unspecified: Secondary | ICD-10-CM | POA: Insufficient documentation

## 2023-08-24 DIAGNOSIS — E785 Hyperlipidemia, unspecified: Secondary | ICD-10-CM | POA: Insufficient documentation

## 2023-08-24 MED ORDER — IRBESARTAN 75 MG PO TABS: 75.0000 mg | ORAL_TABLET | Freq: Every day | ORAL | 11 refills | Status: DC

## 2023-08-24 MED ORDER — ROSUVASTATIN CALCIUM 10 MG PO TABS: 10.0000 mg | ORAL_TABLET | Freq: Every day | ORAL | 6 refills | Status: DC

## 2023-08-24 MED ORDER — OMEPRAZOLE 40 MG PO CPDR: 40.0000 mg | DELAYED_RELEASE_CAPSULE | Freq: Every day | ORAL | 6 refills | Status: DC

## 2023-08-24 MED ORDER — FLUOXETINE HCL 20 MG PO CAPS: 60.0000 mg | ORAL_CAPSULE | Freq: Every morning | ORAL | 11 refills | Status: DC

## 2023-08-24 MED ORDER — ALPRAZOLAM 0.25 MG PO TABS
0.2500 mg | ORAL_TABLET | Freq: Every evening | ORAL | 3 refills | Status: DC | PRN
Start: 2023-08-24 — End: 2024-03-02

## 2023-08-24 NOTE — Assessment & Plan Note (Signed)
She will continue with Dulexetine 60 mg daily and xanax as needed

## 2023-08-24 NOTE — Assessment & Plan Note (Signed)
I will start her on irbesartan 75 mg daily. She will come back for CMP in 2 weeks.

## 2023-08-24 NOTE — Assessment & Plan Note (Signed)
Her BMI is 40, she has lost 5 pounds since last visit. She could not tolerate phentermine before and her insurance did not cover Vagovy.

## 2023-08-24 NOTE — Assessment & Plan Note (Signed)
I will start her on rosuvastatin 10 mg daily. Side effects were discussed with her.

## 2023-08-24 NOTE — Progress Notes (Addendum)
Office Visit  Subjective   Patient ID: Renee Shaw   DOB: [birth year]   Age: 37 y.o.   MRN: [id number]   Chief Complaint Chief Complaint  Patient presents with   Follow-up    Follow up  Medication refill     History of Present Illness The patient is a 37 year old Caucasian/White female who presents for follow up. She says that she is feeling better, but her blood pressure is still elevated.  She has a history of hypertension during pregnancy but she stopped taking medication after that.  Her blood pressure was high last time and I have discussed with her about hypertension.  She was also under a lot of stress at that time.    I have also discuss with her about high cholesterol and wrist factor for cardiovascular disease in her particular case.  She has hypertension, hyperlipidemia and morbid obesity and suggested that she should take cholesterol medication.  She has a family history of hyperlipidemia.    She is morbidly obese female who has lost 5 lb weight.  She could not tolerate phentermine because she became very jittery.  She also could not take will go we because her insurance did not cover.  She is watching her diet and staying active.  Her BMI is 40 today.    She has a history of anxiety and OCD.  Her symptoms are well controlled with  fluoxetine 60 mg daily and once in awhile she take alprazolam as well.  She does not have any suicidal or homicidal ideation.  He is still able to maintain a job.     She also has gastroesophageal reflux disease and says that she has to use omeprazole every day.  She take omeprazole that does help with heartburn.  She is asking for refill for that.    Past Medical History Past Medical History:  Diagnosis Date   Allergy    Anxiety    Breech presentation delivered 11/13/2014   CHICKENPOX, HX OF    Elevated blood pressure 11/26/2014   Headache on top of head 11/26/2014   Heartburn in pregnancy    Migraine headache with aura    Postpartum  care following cesarean delivery (1/25) 11/13/2014   Restless leg syndrome      Allergies No Known Allergies   Review of Systems Review of Systems  Constitutional: Negative.   HENT: Negative.    Respiratory: Negative.    Cardiovascular: Negative.   Gastrointestinal: Negative.   Neurological: Negative.        Objective:    Vitals BP (!) 144/80 (BP Location: Left Arm, Patient Position: Sitting, Cuff Size: Normal)   Pulse 88   Temp 98.3 F (36.8 C)   Resp 18   Ht 5\' 8"  (1.727 m)   Wt 265 lb (120.2 kg)   SpO2 96%   BMI 40.29 kg/m    Physical Examination Physical Exam Constitutional:      Appearance: Normal appearance. She is obese.  HENT:     Head: Normocephalic and atraumatic.  Cardiovascular:     Rate and Rhythm: Normal rate and regular rhythm.     Heart sounds: Normal heart sounds.  Pulmonary:     Effort: Pulmonary effort is normal.     Breath sounds: Normal breath sounds.  Abdominal:     General: Bowel sounds are normal.     Palpations: Abdomen is soft.  Neurological:     General: No focal deficit present.  Mental Status: She is alert and oriented to person, place, and time.        Assessment & Plan:   Hypertension I will start her on irbesartan 75 mg daily. She will come back for CMP in 2 weeks.   OCD (obsessive compulsive disorder) She will continue with Dulexetine 60 mg daily and xanax as needed  Dyslipidemia I will start her on rosuvastatin 10 mg daily. Side effects were discussed with her.   Morbid obesity ([facility]) Her BMI is 40, she has lost 5 pounds since last visit. She could not tolerate phentermine before and her insurance did not cover Vagovy.     Return in about 3 months (around 11/24/2023).   Eloisa Northern, MD

## 2023-09-03 ENCOUNTER — Ambulatory Visit: Payer: 59

## 2023-09-06 ENCOUNTER — Encounter (HOSPITAL_COMMUNITY): Payer: Self-pay

## 2023-09-06 ENCOUNTER — Ambulatory Visit (HOSPITAL_COMMUNITY)
Admission: EM | Admit: 2023-09-06 | Discharge: 2023-09-06 | Disposition: A | Discharge status: Home / Self Care | Payer: 59 | Attending: Physician Assistant | Admitting: Physician Assistant

## 2023-09-06 DIAGNOSIS — G43109 Migraine with aura, not intractable, without status migrainosus: Secondary | ICD-10-CM

## 2023-09-06 DIAGNOSIS — I10 Essential (primary) hypertension: Secondary | ICD-10-CM | POA: Diagnosis not present

## 2023-09-06 MED ORDER — CYCLOBENZAPRINE HCL 5 MG PO TABS: 5.0000 mg | ORAL_TABLET | Freq: Every evening | ORAL | 0 refills | Status: AC | PRN

## 2023-09-06 MED ORDER — KETOROLAC TROMETHAMINE 30 MG/ML IJ SOLN
30.0000 mg | Freq: Once | INTRAMUSCULAR | Status: AC
  Administered 2023-09-06: 30 mg via INTRAMUSCULAR

## 2023-09-06 MED ORDER — KETOROLAC TROMETHAMINE 30 MG/ML IJ SOLN
INTRAMUSCULAR | Status: AC
  Filled 2023-09-06: qty 1

## 2023-09-06 NOTE — ED Provider Notes (Signed)
[facility]    [name]: [id number] Arrival date & time: 09/06/23  1341      History   Chief Complaint Chief Complaint  Patient presents with   Headache   Migraine    HPI Renee Shaw is a 37 y.o. female.   Please presents today with a 2-day history of recurrent/intermittent headaches.  She has a history of migraines and has noticed over the past few days she has been getting recurrent auras with associated headaches.  These are her typical headaches but she has not identified any trigger.  She has previously been evaluated by neurologist and ophthalmology and had extensive workup at which point symptoms were attributed to migraine with aura.  She identified a primary trigger as looking at a computer screen and so was able to wear sunglasses while working which had improved her symptoms and she has not had a headache in over a year.  She did take Excedrin Migraine when current symptoms began which provided temporary relief of symptoms.  Her primary concern is that normally her headaches do not occur at this frequently back-to-back.  Pain is rated 4/5 on a 0-10 pain scale, described as throbbing, localized to the area behind her left eye, no aggravating relieving factors identified.  She denies any photophobia, phonophobia, nausea, vomiting.  She denies any head injury or medication changes.  She does have significant health-related anxiety and so the fact that these headaches have been coming more frequently has caused her anxiety to increase.  She has taken several doses of her prescribed Xanax which has provided minimal improvement of symptoms.  She was sick last week but denies any ongoing symptoms.  She denies any dizziness, dysarthria, visual disturbance, photophobia, focal weakness.    Past Medical History:  Diagnosis Date   Allergy    Anxiety    Breech presentation delivered 11/13/2014   CHICKENPOX, HX OF    Elevated blood pressure 11/26/2014   Headache on top of head  11/26/2014   Heartburn in pregnancy    Migraine headache with aura    Postpartum care following cesarean delivery (1/25) 11/13/2014   Restless leg syndrome     Patient Active Problem List   Diagnosis Date Noted   OCD (obsessive compulsive disorder) 08/24/2023   Dyslipidemia 08/24/2023   Hypertension 01/06/2023   Depression 01/05/2023   GERD (gastroesophageal reflux disease) 01/05/2023   Screening for diabetes mellitus (DM) 01/05/2023   Morbid obesity (HCC) 01/05/2023   Headache on top of head 11/26/2014   Elevated blood pressure 11/26/2014   Breech birth 11/14/2014   Breech presentation delivered 11/13/2014   Postpartum care following cesarean delivery (1/25) 11/13/2014   Anxiety    Migraine headache with aura 03/18/2012   Personal history presenting hazards to health 11/23/2009    Past Surgical History:  Procedure Laterality Date   CESAREAN SECTION N/A 11/13/2014   Procedure: Primary CESAREAN SECTION;  Surgeon: Tresa Endo A. Ernestina Penna, MD;  Location: [facility];  Service: Obstetrics;  Laterality: N/A;  EDD: 11/16/14   MOUTH SURGERY     cyst removed x 3    OB History     Gravida  1   Para  1   Term  1   Preterm      AB      Living  1      SAB      IAB      Ectopic      Multiple  0   Live Births  1  Home Medications    Prior to Admission medications   Medication Sig Start Date End Date Taking? Authorizing Provider  ALPRAZolam (XANAX) 0.25 MG tablet Take 1 tablet (0.25 mg total) by mouth at bedtime as needed for anxiety. 08/24/23  Yes Eloisa Northern, MD  cyclobenzaprine (FLEXERIL) 5 MG tablet Take 1 tablet (5 mg total) by mouth at bedtime as needed (headache). 09/06/23  Yes Laiylah Roettger K, PA-C  FLUoxetine (PROZAC) 20 MG capsule Take 3 capsules (60 mg total) by mouth every morning. 08/24/23  Yes Eloisa Northern, MD  omeprazole (PRILOSEC) 40 MG capsule Take 1 capsule (40 mg total) by mouth daily. 08/24/23  Yes Eloisa Northern, MD  ibuprofen (ADVIL) 600 MG tablet Take  1 tablet (600 mg total) by mouth every 6 (six) hours as needed for mild pain. 01/10/21   Wieters, Hallie C, PA-C  irbesartan (AVAPRO) 75 MG tablet Take 1 tablet (75 mg total) by mouth daily. 08/24/23 08/23/24  Eloisa Northern, MD  rosuvastatin (CRESTOR) 10 MG tablet Take 1 tablet (10 mg total) by mouth daily. 08/24/23   Eloisa Northern, MD  iron polysaccharides (NIFEREX) 150 MG capsule Take 1 capsule (150 mg total) by mouth 2 (two) times daily. 11/15/14 01/10/21  Raelyn Mora, CNM    Family History Family History  Problem Relation Age of Onset   Diabetes Father    Heart attack Father 45       x6   Migraines Maternal Grandmother    Cancer Maternal Grandfather        liver   Cancer Paternal Grandmother        lung    Social History Social History   Tobacco Use   Smoking status: Former    Current packs/day: 0.00    Average packs/day: 0.5 packs/day for 10.0 years (5.0 ttl pk-yrs)    Types: Cigarettes    Start date: 02/18/2004    Quit date: 02/17/2014    Years since quitting: 9.5   Smokeless tobacco: Never   Tobacco comments:    works for [employer]   Vaping status: Every Day   Substances: Nicotine, Flavoring  Substance Use Topics   Alcohol use: Yes    Alcohol/week: 6.0 standard drinks of alcohol    Types: 6 Cans of beer per week    Comment: sometimes   Drug use: No     Allergies   Patient has no known allergies.   Review of Systems Review of Systems  Constitutional:  Positive for activity change. Negative for appetite change, fatigue and fever.  Eyes:  Negative for photophobia and visual disturbance.  Respiratory:  Negative for shortness of breath.   Cardiovascular:  Negative for chest pain and palpitations.  Gastrointestinal:  Negative for abdominal pain, diarrhea, nausea and vomiting.  Neurological:  Positive for headaches. Negative for dizziness, syncope, speech difficulty, weakness, light-headedness and numbness.     Physical Exam Triage Vital  Signs ED Triage Vitals  Encounter Vitals Group     BP 09/06/23 1408 (!) 162/125     Systolic BP Percentile --      Diastolic BP Percentile --      Pulse Rate 09/06/23 1408 (!) 108     Resp 09/06/23 1408 18     Temp 09/06/23 1408 98.5 F (36.9 C)     Temp Source 09/06/23 1408 Oral     SpO2 09/06/23 1408 100 %     Weight --      Height --  Head Circumference --      Peak Flow --      Pain Score 09/06/23 1410 3     Pain Loc --      Pain Education --      Exclude from Growth Chart --    No data found.  Updated Vital Signs BP (!) 159/120 (BP Location: Right Arm)   Pulse 90   Temp 98.5 F (36.9 C) (Oral)   Resp 18   SpO2 98%   Visual Acuity Right Eye Distance:   Left Eye Distance:   Bilateral Distance:    Right Eye Near:   Left Eye Near:    Bilateral Near:     Physical Exam Vitals reviewed.  Constitutional:      General: She is awake. She is not in acute distress.    Appearance: Normal appearance. She is well-developed. She is not ill-appearing.     Comments: Very pleasant female appears stated age in no acute distress sitting comfortably exam room  HENT:     Head: Normocephalic and atraumatic. No raccoon eyes, Battle's sign or contusion.     Right Ear: Tympanic membrane, ear canal and external ear normal. No hemotympanum.     Left Ear: Tympanic membrane, ear canal and external ear normal. No hemotympanum.     Nose: Nose normal.     Mouth/Throat:     Tongue: Tongue does not deviate from midline.     Pharynx: Uvula midline. No oropharyngeal exudate or posterior oropharyngeal erythema.  Eyes:     Extraocular Movements: Extraocular movements intact.     Conjunctiva/sclera: Conjunctivae normal.     Pupils: Pupils are equal, round, and reactive to light.  Cardiovascular:     Rate and Rhythm: Normal rate and regular rhythm.     Heart sounds: Normal heart sounds, S1 normal and S2 normal. No murmur heard. Pulmonary:     Effort: Pulmonary effort is normal.      Breath sounds: Normal breath sounds. No wheezing, rhonchi or rales.     Comments: Clear to auscultation bilaterally Musculoskeletal:     Cervical back: Normal range of motion and neck supple.     Comments: Strength 5/5 bilateral upper and lower extremities.  Lymphadenopathy:     Head:     Right side of head: No submental, submandibular or tonsillar adenopathy.     Left side of head: No submental, submandibular or tonsillar adenopathy.  Neurological:     General: No focal deficit present.     Mental Status: She is alert and oriented to person, place, and time.     Cranial Nerves: Cranial nerves 2-12 are intact.     Motor: Motor function is intact. No weakness.     Coordination: Coordination is intact.     Gait: Gait is intact.     Comments: Cranial nerves II through XII grossly intact.  No focal neurological defect on exam.   Psychiatric:        Behavior: Behavior is cooperative.      UC Treatments / Results  Labs (all labs ordered are listed, but only abnormal results are displayed) Labs Reviewed - No data to display  EKG   Radiology No results found.  Procedures Procedures (including critical care time)  Medications Ordered in UC Medications  ketorolac (TORADOL) 30 MG/ML injection 30 mg (30 mg Intramuscular Given 09/06/23 1512)    Initial Impression / Assessment and Plan / UC Course  I have reviewed the triage vital signs and the nursing  notes.  Pertinent labs & imaging results that were available during my care of the patient were reviewed by me and considered in my medical decision making (see chart for details).     Patient was initially mildly tachycardic, hypertensive but was afebrile, nontoxic.  She reported being significantly anxious and believes that this was contributing to her symptoms.  After she was feeling better her heart rate did improve/normalize.  Her symptoms are consistent with her typical migraines they are just happening more frequently without  an identifiable trigger.  She denies this being the worst headache of her life and so we will defer ER evaluation.  She was given Toradol in clinic with almost complete resolution of headache and was feeling much better.  She was instructed to take NSAIDs for the next 24 hours but can use Tylenol as needed.  She was given Flexeril to be used at night to help with her headaches.  Recommend she rest and drink plenty of fluids.  We discussed that if her symptoms worsen in any way and she has severe headache, worsening of her life, nausea, vomiting, visual disturbance, focal weakness, dizziness she needs to be seen emergently.  Strict return precautions given.  Work excuse note provided.  Her blood pressure is elevated we discussed that she needs to start the medication that was prescribed by her PCP.  We discussed that if she develops worsening headache, severe headache, dizziness, chest pain, shortness of breath and sitting for blood pressure she needs to go to the ER.  She is to follow-up closely with her primary care.  Recommend she avoid decongestants, caffeine, sodium, NSAIDs.  Strict return precautions given.  Final Clinical Impressions(s) / UC Diagnoses   Final diagnoses:  Migraine with aura and without status migrainosus, not intractable  Elevated blood pressure reading with diagnosis of hypertension     Discharge Instructions      I am glad you are feeling better after the medication.  Do not take any NSAIDs for 24 hours including aspirin, ibuprofen/Advil, naproxen/Aleve.  I have called in Flexeril in case your headache returns.  This will make you sleepy so do not drive or drink alcohol with taking it.  I believe your symptoms are related to ongoing migraines and I am hopeful they will improve and go away.  If at any point something worsens and you have the worst headache of your life, sudden severe headache, nausea, vomiting, vision change, weakness in 1 part of your body, difficulty speaking  you need to go to the emergency room.  Your blood pressure is elevated.  Please start the medication that your primary care provider prescribed.  Take this at night.  Avoid decongestants, caffeine, sodium, NSAIDs (aspirin, ibuprofen/Advil, naproxen/Aleve).  If you have severe headache, chest pain, shortness of breath, vision change in the setting of high blood pressure you need to go to the emergency room.     ED Prescriptions     Medication Sig Dispense Auth. Provider   cyclobenzaprine (FLEXERIL) 5 MG tablet Take 1 tablet (5 mg total) by mouth at bedtime as needed (headache). 10 tablet Sevana Grandinetti, Noberto Retort, PA-C      PDMP not reviewed this encounter.   Jeani Hawking, PA-C 09/06/23 1607

## 2023-09-06 NOTE — ED Triage Notes (Signed)
Pt saw her PCP this week and they prescribed her blood pressure medication however she has not started the medication.

## 2023-09-06 NOTE — ED Triage Notes (Signed)
Pt reports several headache with aura over the last 2 days. Pt states she has bad anxiety and took a Xanax before she came.  Pt denies any dizziness or blurred vision at this time.

## 2023-09-06 NOTE — Discharge Instructions (Signed)
I am glad you are feeling better after the medication.  Do not take any NSAIDs for 24 hours including aspirin, ibuprofen/Advil, naproxen/Aleve.  I have called in Flexeril in case your headache returns.  This will make you sleepy so do not drive or drink alcohol with taking it.  I believe your symptoms are related to ongoing migraines and I am hopeful they will improve and go away.  If at any point something worsens and you have the worst headache of your life, sudden severe headache, nausea, vomiting, vision change, weakness in 1 part of your body, difficulty speaking you need to go to the emergency room.  Your blood pressure is elevated.  Please start the medication that your primary care provider prescribed.  Take this at night.  Avoid decongestants, caffeine, sodium, NSAIDs (aspirin, ibuprofen/Advil, naproxen/Aleve).  If you have severe headache, chest pain, shortness of breath, vision change in the setting of high blood pressure you need to go to the emergency room.

## 2023-11-27 ENCOUNTER — Encounter: Payer: 59 | Admitting: Internal Medicine

## 2023-12-09 ENCOUNTER — Encounter: Payer: 59 | Admitting: Internal Medicine

## 2024-02-08 ENCOUNTER — Other Ambulatory Visit: Payer: Self-pay | Admitting: Internal Medicine

## 2024-03-02 ENCOUNTER — Other Ambulatory Visit: Payer: Self-pay | Admitting: Internal Medicine

## 2024-03-02 MED ORDER — ALPRAZOLAM 0.25 MG PO TABS
0.2500 mg | ORAL_TABLET | Freq: Every evening | ORAL | 0 refills | Status: DC | PRN
Start: 2024-03-02 — End: 2024-06-27

## 2024-03-09 ENCOUNTER — Encounter: Admitting: Internal Medicine

## 2024-03-18 ENCOUNTER — Encounter: Admitting: Internal Medicine

## 2024-06-27 ENCOUNTER — Encounter: Payer: Self-pay | Admitting: Internal Medicine

## 2024-06-27 ENCOUNTER — Ambulatory Visit: Admitting: Internal Medicine

## 2024-06-27 VITALS — BP 130/78 | HR 80 | Temp 98.2°F | Resp 18 | Ht 68.0 in | Wt 260.2 lb

## 2024-06-27 DIAGNOSIS — G43109 Migraine with aura, not intractable, without status migrainosus: Secondary | ICD-10-CM | POA: Diagnosis not present

## 2024-06-27 DIAGNOSIS — E785 Hyperlipidemia, unspecified: Secondary | ICD-10-CM

## 2024-06-27 DIAGNOSIS — I10 Essential (primary) hypertension: Secondary | ICD-10-CM | POA: Diagnosis not present

## 2024-06-27 DIAGNOSIS — F321 Major depressive disorder, single episode, moderate: Secondary | ICD-10-CM

## 2024-06-27 DIAGNOSIS — K219 Gastro-esophageal reflux disease without esophagitis: Secondary | ICD-10-CM

## 2024-06-27 MED ORDER — ALPRAZOLAM 0.25 MG PO TABS
0.2500 mg | ORAL_TABLET | Freq: Every evening | ORAL | 0 refills | Status: DC | PRN
Start: 2024-06-27 — End: 2024-08-15

## 2024-06-27 MED ORDER — FLUOXETINE HCL 60 MG PO TABS
1.0000 | ORAL_TABLET | Freq: Every day | ORAL | 11 refills | Status: DC
Start: 2024-06-27 — End: 2024-08-15

## 2024-06-27 NOTE — Assessment & Plan Note (Signed)
 She will continue with omeprazole  40 mg daily.

## 2024-06-27 NOTE — Assessment & Plan Note (Signed)
 She will continue with duloxetine 60 mg daily.

## 2024-06-27 NOTE — Assessment & Plan Note (Signed)
 She will come back fasting for lipid panel, CMP, hepatitis panel and hemoglobin A1c.

## 2024-06-27 NOTE — Progress Notes (Addendum)
 Office Visit  Subjective   Patient ID: Renee Shaw   DOB: 03/10/1986   Age: 38 y.o.   MRN: [id number]   Chief Complaint Chief Complaint  Patient presents with   Hypertension     History of Present Illness The patient is a 38 year old Caucasian/White female who presents for follow up. She says that she  Could not come for blood draw but she has few days of work and she will come here for labs drawn.  Her cholesterol was high and she did not wanted to take rosuvastatin  that was prescribed.  She says that she is watching her diet and make changes  In her lifestyle and she is hoping that her cholesterol will be better.    She says that she is watching her diet.  She has lost 5 lb since last visit.  Her weight is 260 lb with BMI of 39 now.  Her liver function was slightly abnormal on last visit.  I have discussed with her that will repeat liver function and hepatitis panel with next lab drawn.    She also says that her husband does not pay child support.  She is under lot of stress mostly coming from her ex.  She has been taking fluoxetine  20 mg 3  capsule daily.   She is asking to get tablet that is 60 mg. She also use alprazolam  when she is overwhelmed. She reports there have been no other symptoms noted.     She also has gastroesophageal reflux disease and says that she has to use omeprazole  every day.  If she Mr. does she get heartburns.  I have discussed the complication of long-term use of omeprazole .  She will cut down the portion of her meal and will try Pepcid 1st to see if that controls her symptoms.     She says that she has migraine with aura couple of time issue vent to the emergency room.  Her blood pressure was high and she was advised to cut down her screen time.  She says that she did not have any further migraine.   Past Medical History Past Medical History:  Diagnosis Date   Allergy    Anxiety    Breech presentation delivered 11/13/2014   CHICKENPOX, HX OF     Elevated blood pressure 11/26/2014   Headache on top of head 11/26/2014   Heartburn in pregnancy    Migraine headache with aura    Postpartum care following cesarean delivery (1/25) 11/13/2014   Restless leg syndrome      Allergies No Known Allergies   Review of Systems Review of Systems  Constitutional: Negative.   HENT: Negative.    Respiratory: Negative.    Cardiovascular: Negative.   Gastrointestinal: Negative.   Neurological: Negative.        Objective:    Vitals BP 130/78 (BP Location: Left Arm, Patient Position: Sitting, Cuff Size: Normal)   Pulse 80   Temp 98.2 F (36.8 C)   Resp 18   Ht 5' 8 (1.727 m)   Wt 260 lb 4 oz (118 kg)   SpO2 97%   BMI 39.57 kg/m    Physical Examination Physical Exam Constitutional:      Appearance: Normal appearance. She is obese.  HENT:     Head: Normocephalic and atraumatic.  Eyes:     Extraocular Movements: Extraocular movements intact.     Pupils: Pupils are equal, round, and reactive to light.  Cardiovascular:  Rate and Rhythm: Normal rate and regular rhythm.     Heart sounds: Normal heart sounds.  Pulmonary:     Effort: Pulmonary effort is normal.     Breath sounds: Normal breath sounds.  Abdominal:     General: Bowel sounds are normal.     Palpations: Abdomen is soft.  Neurological:     General: No focal deficit present.     Mental Status: She is alert and oriented to person, place, and time.        Assessment & Plan:   Migraine headache with aura   No more migraine.  Hypertension   Her blood pressure is controlled and she does not take any medication for that.  GERD (gastroesophageal reflux disease)   She will continue with omeprazole  40 mg daily.  Morbid obesity (HCC)   Her BMI is 39 with underlying hyperlipidemia make it morbidly obese.  Dyslipidemia   She will come back fasting for lipid panel, CMP, hepatitis panel and hemoglobin A1c.  Depression   She will continue with duloxetine 60 mg  daily.    Return in about 3 months (around 09/26/2024).   Roetta Dare, MD

## 2024-06-27 NOTE — Assessment & Plan Note (Signed)
 Her blood pressure is controlled and she does not take any medication for that.

## 2024-06-27 NOTE — Assessment & Plan Note (Signed)
 No more migraine.

## 2024-06-27 NOTE — Assessment & Plan Note (Signed)
 Her BMI is 39 with underlying hyperlipidemia make it morbidly obese.

## 2024-07-06 ENCOUNTER — Other Ambulatory Visit: Payer: Self-pay | Admitting: Internal Medicine

## 2024-08-15 ENCOUNTER — Ambulatory Visit: Admitting: Internal Medicine

## 2024-08-15 ENCOUNTER — Encounter: Payer: Self-pay | Admitting: Internal Medicine

## 2024-08-15 VITALS — BP 128/80 | HR 84 | Resp 18 | Ht 68.0 in | Wt 262.4 lb

## 2024-08-15 DIAGNOSIS — I10 Essential (primary) hypertension: Secondary | ICD-10-CM | POA: Diagnosis not present

## 2024-08-15 DIAGNOSIS — R945 Abnormal results of liver function studies: Secondary | ICD-10-CM | POA: Insufficient documentation

## 2024-08-15 DIAGNOSIS — Z6839 Body mass index (BMI) 39.0-39.9, adult: Secondary | ICD-10-CM | POA: Diagnosis not present

## 2024-08-15 DIAGNOSIS — Z131 Encounter for screening for diabetes mellitus: Secondary | ICD-10-CM

## 2024-08-15 DIAGNOSIS — G43109 Migraine with aura, not intractable, without status migrainosus: Secondary | ICD-10-CM

## 2024-08-15 DIAGNOSIS — Z0000 Encounter for general adult medical examination without abnormal findings: Secondary | ICD-10-CM | POA: Insufficient documentation

## 2024-08-15 DIAGNOSIS — E785 Hyperlipidemia, unspecified: Secondary | ICD-10-CM | POA: Diagnosis not present

## 2024-08-15 DIAGNOSIS — F422 Mixed obsessional thoughts and acts: Secondary | ICD-10-CM

## 2024-08-15 DIAGNOSIS — F419 Anxiety disorder, unspecified: Secondary | ICD-10-CM

## 2024-08-15 DIAGNOSIS — K219 Gastro-esophageal reflux disease without esophagitis: Secondary | ICD-10-CM

## 2024-08-15 MED ORDER — FLUOXETINE HCL 40 MG PO CAPS
40.0000 mg | ORAL_CAPSULE | Freq: Every day | ORAL | 0 refills | Status: DC
Start: 2024-08-15 — End: 2024-09-07

## 2024-08-15 MED ORDER — ALPRAZOLAM 0.25 MG PO TABS: 0.2500 mg | ORAL_TABLET | Freq: Every evening | ORAL | 2 refills | Status: AC | PRN

## 2024-08-15 MED ORDER — TETANUS-DIPHTHERIA TOXOIDS TD 2-2 LF/0.5ML IM SUSP: 0.5000 mL | Freq: Once | INTRAMUSCULAR | 0 refills | Status: AC

## 2024-08-15 NOTE — Assessment & Plan Note (Signed)
I will do hemoglobin A1c today.

## 2024-08-15 NOTE — Assessment & Plan Note (Addendum)
 He take alprazolam  as needed only.  As her depression and OCD symptoms are controlled I have suggested to taper fluoxetine  off and I will add bupropion  so we can start her on phentermine for weight loss.  She will decrease the dose of fluoxetine  to 40 mg daily for a month and then will decrease to 20 mg daily next month.

## 2024-08-15 NOTE — Progress Notes (Signed)
 Office Visit  Subjective   Patient ID: UNNAMED HINO   DOB: 1986/09/15   Age: 38 y.o.   MRN: [id number]   Chief Complaint Chief Complaint  Patient presents with   Annual Exam     History of Present Illness 38 years is here for annual physical examination.   She has diuresed, currently she is single and look after her son.   She says that she does vape. She does not smoke, she drink occasionally. She does not get flu shot anymore. She says that her tetanus was more than 10 years ago.   She denies any family history of cancer, diabetes or heart attack.   Her has IUD and do not get periods any more.   She follows with gynecologist and has Pap smear with them.  She was told how to do breast exam and she has been doing that.  She do not have mammogram.    She has hypertension and she take irbesartan  75 mg daily.  Her blood pressure is controlled.    She has history of depression and  obsessive-compulsive disorder, she takes fluoxetine  60 mg daily and alprazolam  0.25 only as needed but do not take it every day.  She do not have any suicidal ideation.    She has gastroesophageal reflux disease and she takes omeprazole  40 mg daily.  That is helping with her symptoms.    She has hyperlipidemia and last year I have started her on rosuvastatin  10 mg but she says that she never took that medicine.    Her liver function was slightly elevated on last blood draw.  She is obese female with weight of 262 lb and BMI of 39. She has gained 2 lb since last visit.  She says that she could not tolerate phentermine because of fear of serotonin syndrome being on fluoxetine  60 mg daily.  Past Medical History Past Medical History:  Diagnosis Date   Allergy    Anxiety    Breech presentation delivered 11/13/2014   CHICKENPOX, HX OF    Elevated blood pressure 11/26/2014   Headache on top of head 11/26/2014   Heartburn in pregnancy    Migraine headache with aura    Postpartum care following cesarean  delivery (1/25) 11/13/2014   Restless leg syndrome      Allergies No Known Allergies   Review of Systems Review of Systems  Constitutional: Negative.   HENT: Negative.    Respiratory: Negative.    Cardiovascular: Negative.   Gastrointestinal: Negative.   Neurological: Negative.        Objective:    Vitals BP 128/80 (BP Location: Left Arm, Patient Position: Sitting, Cuff Size: Normal)   Pulse 84   Resp 18   Ht 5' 8 (1.727 m)   Wt 262 lb 6 oz (119 kg)   BMI 39.89 kg/m    Physical Examination Physical Exam Constitutional:      Appearance: Normal appearance.  HENT:     Head: Normocephalic and atraumatic.  Cardiovascular:     Rate and Rhythm: Normal rate and regular rhythm.     Heart sounds: Normal heart sounds.  Pulmonary:     Effort: Pulmonary effort is normal.     Breath sounds: Normal breath sounds.  Abdominal:     General: Bowel sounds are normal.     Palpations: Abdomen is soft.  Neurological:     General: No focal deficit present.     Mental Status: She is alert.  Assessment & Plan:   Morbid obesity (HCC)   I have discussed with her that she needs to cut down portion of her meal.  As her depression and OCD is under control I have suggested to taper fluoxetine  off so we can start her on phentermine for weight loss.  Migraine headache with aura   She did not have migraine within last 1 year.  Hypertension   Her blood pressure is controlled on irbesartan  75 mg daily.  Her renal function was normal last month.  GERD (gastroesophageal reflux disease)   She will continue with omeprazole  40 mg daily.  Screening for diabetes mellitus (DM)   I will do hemoglobin A1c today.  OCD (obsessive compulsive disorder)   He take alprazolam  as needed only.  As her depression and OCD symptoms are controlled I have suggested to taper fluoxetine  off and I will add bupropion  so we can start her on phentermine for weight loss.  She will decrease the dose of  fluoxetine  to 40 mg daily for a month and then will decrease to 20 mg daily next month.  Annual physical exam   I have discussed with her to stop vaping as well.  I will send prescription for tetanus booster.  I will also schedule her mammogram.  Abnormal liver function   I will repeat liver function and also do hepatitis screen.    Return in about 2 months (around 10/15/2024).   Roetta Dare, MD

## 2024-08-15 NOTE — Assessment & Plan Note (Signed)
 She will continue with omeprazole  40 mg daily.

## 2024-08-15 NOTE — Assessment & Plan Note (Signed)
 She did not have migraine within last 1 year.

## 2024-08-15 NOTE — Assessment & Plan Note (Signed)
 I will repeat liver function and also do hepatitis screen.

## 2024-08-15 NOTE — Assessment & Plan Note (Signed)
 I have discussed with her that she needs to cut down portion of her meal.  As her depression and OCD is under control I have suggested to taper fluoxetine  off so we can start her on phentermine for weight loss.

## 2024-08-15 NOTE — Assessment & Plan Note (Signed)
 I have discussed with her to stop vaping as well.  I will send prescription for tetanus booster.  I will also schedule her mammogram.

## 2024-08-15 NOTE — Assessment & Plan Note (Signed)
 Her blood pressure is controlled on irbesartan  75 mg daily.  Her renal function was normal last month.

## 2024-08-16 LAB — CMP14 + ANION GAP
ALT: 26 IU/L (ref 0–32)
AST: 26 IU/L (ref 0–40)
Albumin: 4.6 g/dL (ref 3.9–4.9)
Alkaline Phosphatase: 63 IU/L (ref 41–116)
Anion Gap: 15 mmol/L (ref 10.0–18.0)
BUN/Creatinine Ratio: 17 (ref 9–23)
BUN: 11 mg/dL (ref 6–20)
Bilirubin Total: 0.3 mg/dL (ref 0.0–1.2)
CO2: 21 mmol/L (ref 20–29)
Calcium: 9.2 mg/dL (ref 8.7–10.2)
Chloride: 103 mmol/L (ref 96–106)
Creatinine, Ser: 0.65 mg/dL (ref 0.57–1.00)
Globulin, Total: 2.4 g/dL (ref 1.5–4.5)
Glucose: 95 mg/dL (ref 70–99)
Potassium: 4.4 mmol/L (ref 3.5–5.2)
Sodium: 139 mmol/L (ref 134–144)
Total Protein: 7 g/dL (ref 6.0–8.5)
eGFR: 115 mL/min/1.73 (ref >59)

## 2024-08-16 LAB — LIPID PANEL
Chol/HDL Ratio: 4.6 ratio — ABNORMAL HIGH (ref 0.0–4.4)
Cholesterol, Total: 247 mg/dL — ABNORMAL HIGH (ref 100–199)
HDL: 54 mg/dL (ref >39)
LDL Chol Calc (NIH): 149 mg/dL — ABNORMAL HIGH (ref 0–99)
Triglycerides: 241 mg/dL — ABNORMAL HIGH (ref 0–149)
VLDL Cholesterol Cal: 44 mg/dL — ABNORMAL HIGH (ref 5–40)

## 2024-08-16 LAB — HEMOGLOBIN A1C
Est. average glucose Bld gHb Est-mCnc: 91 mg/dL
Hgb A1c MFr Bld: 4.8 % (ref 4.8–5.6)

## 2024-08-16 LAB — VITAMIN D 25 HYDROXY (VIT D DEFICIENCY, FRACTURES): Vit D, 25-Hydroxy: 22.1 ng/mL — ABNORMAL LOW (ref 30.0–100.0)

## 2024-08-16 LAB — TSH: TSH: 2.69 u[IU]/mL (ref 0.450–4.500)

## 2024-08-19 ENCOUNTER — Ambulatory Visit: Payer: Self-pay

## 2024-08-19 NOTE — Progress Notes (Signed)
 Patient called.  Patient aware. Her cholesterol is better than before but still high. Will continue to monitor. Her vitamin-D is low so she need to take 5000 units daily.

## 2024-08-30 ENCOUNTER — Other Ambulatory Visit: Payer: Self-pay | Admitting: Internal Medicine

## 2024-09-07 ENCOUNTER — Other Ambulatory Visit: Payer: Self-pay | Admitting: Internal Medicine

## 2024-09-07 MED ORDER — FLUOXETINE HCL 60 MG PO TABS: 1.0000 | ORAL_TABLET | Freq: Every day | ORAL | 3 refills | Status: DC

## 2024-09-26 ENCOUNTER — Ambulatory Visit: Admitting: Internal Medicine

## 2024-10-10 ENCOUNTER — Ambulatory Visit: Admitting: Internal Medicine

## 2024-10-19 ENCOUNTER — Ambulatory Visit: Admitting: Internal Medicine

## 2024-11-02 ENCOUNTER — Ambulatory Visit: Admitting: Internal Medicine

## 2024-11-02 ENCOUNTER — Encounter: Payer: Self-pay | Admitting: Internal Medicine

## 2024-11-02 VITALS — BP 130/80 | HR 91 | Temp 97.7°F | Resp 18 | Ht 68.0 in | Wt 246.5 lb

## 2024-11-02 DIAGNOSIS — I10 Essential (primary) hypertension: Secondary | ICD-10-CM | POA: Diagnosis not present

## 2024-11-02 DIAGNOSIS — F3342 Major depressive disorder, recurrent, in full remission: Secondary | ICD-10-CM

## 2024-11-02 DIAGNOSIS — K219 Gastro-esophageal reflux disease without esophagitis: Secondary | ICD-10-CM | POA: Diagnosis not present

## 2024-11-02 MED ORDER — FLUOXETINE HCL 60 MG PO TABS: 1.0000 | ORAL_TABLET | Freq: Every day | ORAL | 3 refills | Status: AC

## 2024-11-02 NOTE — Progress Notes (Signed)
" ° °  Office Visit  Subjective   Patient ID: Renee Shaw   DOB: [birth year]   Age: 39 y.o.   MRN: [id number]   Chief Complaint Chief Complaint  Patient presents with   Follow-up    2 month     History of Present Illness   39 years old female who is here for follow-up. She has hypertension and she take irbesartan  75 mg daily.  Her blood pressure is controlled.     She has history of depression and  obsessive-compulsive disorder, she takes fluoxetine  60 mg daily and she says that she has not used alprazolam  0.25 in a while only as needed but do not take it every day.  She do not have any suicidal ideation.     She has gastroesophageal reflux disease and she takes omeprazole  40 mg daily.  That is helping with her symptoms.     She has hyperlipidemia and last year I have started her on rosuvastatin  10 mg but she says that she never took that medicine.    Past Medical History Past Medical History:  Diagnosis Date   Allergy    Anxiety    Breech presentation delivered 11/13/2014   CHICKENPOX, HX OF    Elevated blood pressure 11/26/2014   Headache on top of head 11/26/2014   Heartburn in pregnancy    Migraine headache with aura    Postpartum care following cesarean delivery (1/25) 11/13/2014   Restless leg syndrome      Allergies Allergies[1]   Review of Systems Review of Systems  Constitutional: Negative.   HENT: Negative.    Respiratory: Negative.    Cardiovascular: Negative.   Gastrointestinal: Negative.   Neurological: Negative.        Objective:    Vitals BP 130/80   Pulse 91   Temp 97.7 F (36.5 C)   Resp 18   Ht 5' 8 (1.727 m)   Wt 246 lb 8 oz (111.8 kg)   SpO2 99%   BMI 37.48 kg/m    Physical Examination Physical Exam Constitutional:      Appearance: Normal appearance.  HENT:     Head: Normocephalic and atraumatic.  Cardiovascular:     Rate and Rhythm: Normal rate and regular rhythm.     Heart sounds: Normal heart sounds.  Pulmonary:      Effort: Pulmonary effort is normal.     Breath sounds: Normal breath sounds.  Abdominal:     General: Bowel sounds are normal.     Palpations: Abdomen is soft.  Neurological:     General: No focal deficit present.     Mental Status: She is alert and oriented to person, place, and time.        Assessment & Plan:   Hypertension  Her blood pressure is controlled  GERD (gastroesophageal reflux disease)  She will continue with omeprazole  40 mg daily.  Depression   Her depression is better.    Return in about 3 months (around 01/31/2025).   Roetta Dare, MD      [1] No Known Allergies  "

## 2024-11-06 NOTE — Assessment & Plan Note (Signed)
Her depression is better

## 2024-11-06 NOTE — Assessment & Plan Note (Signed)
 She will continue with omeprazole  40 mg daily.

## 2024-11-06 NOTE — Assessment & Plan Note (Signed)
 Her blood pressure is controlled.

## 2025-01-30 ENCOUNTER — Ambulatory Visit: Admitting: Internal Medicine
